# Patient Record
Sex: Female | Born: 1990 | Race: Black or African American | Hispanic: No | Marital: Single | Smoking: Former smoker
Health system: Southern US, Community
[De-identification: ages and names within clinical notes are randomized; demographics above are authoritative.]

## PROBLEM LIST (undated history)

## (undated) ENCOUNTER — Inpatient Hospital Stay (HOSPITAL_COMMUNITY): Payer: Self-pay

## (undated) DIAGNOSIS — A749 Chlamydial infection, unspecified: Secondary | ICD-10-CM

## (undated) DIAGNOSIS — F329 Major depressive disorder, single episode, unspecified: Secondary | ICD-10-CM

## (undated) DIAGNOSIS — F32A Depression, unspecified: Secondary | ICD-10-CM

## (undated) DIAGNOSIS — E669 Obesity, unspecified: Secondary | ICD-10-CM

## (undated) DIAGNOSIS — R519 Headache, unspecified: Secondary | ICD-10-CM

## (undated) DIAGNOSIS — O139 Gestational [pregnancy-induced] hypertension without significant proteinuria, unspecified trimester: Secondary | ICD-10-CM

## (undated) DIAGNOSIS — D649 Anemia, unspecified: Secondary | ICD-10-CM

## (undated) DIAGNOSIS — R51 Headache: Secondary | ICD-10-CM

## (undated) HISTORY — PX: FRACTURE SURGERY: SHX138

## (undated) HISTORY — PX: OTHER SURGICAL HISTORY: SHX169

## (undated) HISTORY — PX: GASTRIC BYPASS: SHX52

---
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
## 2009-04-15 ENCOUNTER — Emergency Department (HOSPITAL_COMMUNITY): Admission: EM | Admit: 2009-04-15 | Discharge: 2009-04-15 | Payer: Self-pay | Admitting: Emergency Medicine

## 2009-04-16 ENCOUNTER — Inpatient Hospital Stay (HOSPITAL_COMMUNITY): Admission: AD | Admit: 2009-04-16 | Discharge: 2009-04-16 | Payer: Self-pay | Admitting: Obstetrics & Gynecology

## 2009-04-19 ENCOUNTER — Inpatient Hospital Stay (HOSPITAL_COMMUNITY): Admission: AD | Admit: 2009-04-19 | Discharge: 2009-04-19 | Payer: Self-pay | Admitting: Obstetrics & Gynecology

## 2009-06-02 ENCOUNTER — Inpatient Hospital Stay (HOSPITAL_COMMUNITY): Admission: AD | Admit: 2009-06-02 | Discharge: 2009-06-02 | Payer: Self-pay | Admitting: Obstetrics & Gynecology

## 2009-07-10 ENCOUNTER — Ambulatory Visit: Payer: Self-pay | Admitting: Family

## 2009-07-10 ENCOUNTER — Inpatient Hospital Stay (HOSPITAL_COMMUNITY): Admission: AD | Admit: 2009-07-10 | Discharge: 2009-07-11 | Payer: Self-pay | Admitting: Obstetrics and Gynecology

## 2009-07-13 ENCOUNTER — Ambulatory Visit (HOSPITAL_COMMUNITY): Admission: RE | Admit: 2009-07-13 | Discharge: 2009-07-13 | Payer: Self-pay | Admitting: Obstetrics and Gynecology

## 2009-08-15 ENCOUNTER — Inpatient Hospital Stay (HOSPITAL_COMMUNITY): Admission: AD | Admit: 2009-08-15 | Discharge: 2009-08-15 | Payer: Self-pay | Admitting: Obstetrics and Gynecology

## 2009-08-15 ENCOUNTER — Ambulatory Visit: Payer: Self-pay | Admitting: Obstetrics and Gynecology

## 2009-10-31 ENCOUNTER — Inpatient Hospital Stay (HOSPITAL_COMMUNITY): Admission: AD | Admit: 2009-10-31 | Discharge: 2009-10-31 | Payer: Self-pay | Admitting: Obstetrics and Gynecology

## 2009-11-07 ENCOUNTER — Inpatient Hospital Stay (HOSPITAL_COMMUNITY): Admission: AD | Admit: 2009-11-07 | Discharge: 2009-11-07 | Payer: Self-pay | Admitting: Obstetrics and Gynecology

## 2009-12-12 ENCOUNTER — Ambulatory Visit (HOSPITAL_COMMUNITY): Admission: AD | Admit: 2009-12-12 | Discharge: 2009-12-12 | Payer: Self-pay | Admitting: Obstetrics and Gynecology

## 2009-12-14 ENCOUNTER — Inpatient Hospital Stay (HOSPITAL_COMMUNITY): Admission: AD | Admit: 2009-12-14 | Discharge: 2009-12-17 | Payer: Self-pay | Admitting: Obstetrics and Gynecology

## 2010-03-19 ENCOUNTER — Emergency Department (HOSPITAL_COMMUNITY): Admission: EM | Admit: 2010-03-19 | Discharge: 2010-03-19 | Payer: Self-pay | Admitting: Emergency Medicine

## 2010-09-05 ENCOUNTER — Encounter: Payer: Self-pay | Admitting: Obstetrics & Gynecology

## 2010-10-28 LAB — WET PREP, GENITAL
WBC, Wet Prep HPF POC: NONE SEEN
Yeast Wet Prep HPF POC: NONE SEEN

## 2010-10-28 LAB — GC/CHLAMYDIA PROBE AMP, GENITAL: Chlamydia, DNA Probe: NEGATIVE

## 2010-11-01 LAB — COMPREHENSIVE METABOLIC PANEL
ALT: 20 U/L (ref 0–35)
Alkaline Phosphatase: 182 U/L — ABNORMAL HIGH (ref 39–117)
CO2: 23 mEq/L (ref 19–32)
Chloride: 105 mEq/L (ref 96–112)
GFR calc non Af Amer: >60 mL/min (ref >60)
Glucose, Bld: 72 mg/dL (ref 70–99)
Potassium: 3.8 mEq/L (ref 3.5–5.1)
Sodium: 134 mEq/L — ABNORMAL LOW (ref 135–145)

## 2010-11-01 LAB — CBC
HCT: 29.3 % — ABNORMAL LOW (ref 36.0–46.0)
Hemoglobin: 11.2 g/dL — ABNORMAL LOW (ref 12.0–15.0)
MCHC: 34.2 g/dL (ref 30.0–36.0)
MCV: 86.3 fL (ref 78.0–100.0)
Platelets: 236 10*3/uL (ref 150–400)
RBC: 3.84 MIL/uL — ABNORMAL LOW (ref 3.87–5.11)
WBC: 8.7 10*3/uL (ref 4.0–10.5)

## 2010-11-01 LAB — LACTATE DEHYDROGENASE: LDH: 155 U/L (ref 94–250)

## 2010-11-01 LAB — PROTEIN, URINE, 24 HOUR: Protein, Urine: 4 mg/dL

## 2010-11-01 LAB — CREATININE, URINE, 24 HOUR
Creatinine, 24H Ur: 1472 mg/d (ref 700–1800)
Creatinine, Urine: 105.9 mg/dL

## 2010-11-07 LAB — URINE CULTURE
Colony Count: NO GROWTH
Culture: NO GROWTH

## 2010-11-07 LAB — URINALYSIS, ROUTINE W REFLEX MICROSCOPIC
Bilirubin Urine: NEGATIVE
Glucose, UA: NEGATIVE mg/dL
Ketones, ur: NEGATIVE mg/dL
Protein, ur: NEGATIVE mg/dL

## 2010-11-07 LAB — WET PREP, GENITAL
Clue Cells Wet Prep HPF POC: NONE SEEN
Trich, Wet Prep: NONE SEEN

## 2010-11-07 LAB — STREP B DNA PROBE

## 2010-11-07 LAB — URINE MICROSCOPIC-ADD ON

## 2010-11-16 LAB — HEPATITIS B SURFACE ANTIGEN: Hepatitis B Surface Ag: NEGATIVE

## 2010-11-16 LAB — DIFFERENTIAL
Basophils Absolute: 0.1 10*3/uL (ref 0.0–0.1)
Basophils Relative: 1 % (ref 0–1)
Lymphocytes Relative: 23 % (ref 12–46)
Neutro Abs: 6.7 10*3/uL (ref 1.7–7.7)
Neutrophils Relative %: 67 % (ref 43–77)

## 2010-11-16 LAB — HIV ANTIBODY (ROUTINE TESTING W REFLEX): HIV: NONREACTIVE

## 2010-11-16 LAB — RPR: RPR Ser Ql: NONREACTIVE

## 2010-11-16 LAB — CBC
Hemoglobin: 10.4 g/dL — ABNORMAL LOW (ref 12.0–15.0)
MCHC: 33.5 g/dL (ref 30.0–36.0)
Platelets: 278 10*3/uL (ref 150–400)
RDW: 13.5 % (ref 11.5–15.5)

## 2010-11-16 LAB — RUBELLA SCREEN: Rubella: 59.4 IU/mL — ABNORMAL HIGH

## 2010-11-17 LAB — URINALYSIS, ROUTINE W REFLEX MICROSCOPIC
Glucose, UA: NEGATIVE mg/dL
Specific Gravity, Urine: 1.02 (ref 1.005–1.030)
pH: 8.5 — ABNORMAL HIGH (ref 5.0–8.0)

## 2010-11-17 LAB — POCT PREGNANCY, URINE: Preg Test, Ur: POSITIVE

## 2010-11-18 LAB — AMYLASE: Amylase: 73 U/L (ref 27–131)

## 2010-11-18 LAB — URINALYSIS, ROUTINE W REFLEX MICROSCOPIC
Bilirubin Urine: NEGATIVE
Hgb urine dipstick: NEGATIVE
Nitrite: NEGATIVE
Nitrite: NEGATIVE
Specific Gravity, Urine: 1.024 (ref 1.005–1.030)
Specific Gravity, Urine: 1.02 (ref 1.005–1.030)
Urobilinogen, UA: 0.2 mg/dL (ref 0.0–1.0)
Urobilinogen, UA: 0.2 mg/dL (ref 0.0–1.0)

## 2010-11-18 LAB — COMPREHENSIVE METABOLIC PANEL
AST: 19 U/L (ref 0–37)
Albumin: 3.6 g/dL (ref 3.5–5.2)
Alkaline Phosphatase: 49 U/L (ref 39–117)
Chloride: 104 mEq/L (ref 96–112)
GFR calc Af Amer: >60 mL/min (ref >60)
Potassium: 3.9 mEq/L (ref 3.5–5.1)
Sodium: 135 mEq/L (ref 135–145)
Total Bilirubin: 0.6 mg/dL (ref 0.3–1.2)

## 2010-11-18 LAB — CBC
Platelets: 341 10*3/uL (ref 150–400)
WBC: 8.9 10*3/uL (ref 4.0–10.5)

## 2010-11-18 LAB — URINE CULTURE: Culture: NO GROWTH

## 2010-11-18 LAB — URINE MICROSCOPIC-ADD ON

## 2010-11-18 LAB — GC/CHLAMYDIA PROBE AMP, GENITAL: GC Probe Amp, Genital: NEGATIVE

## 2010-11-18 LAB — WET PREP, GENITAL: Clue Cells Wet Prep HPF POC: NONE SEEN

## 2010-12-25 ENCOUNTER — Inpatient Hospital Stay (HOSPITAL_COMMUNITY)
Admission: AD | Admit: 2010-12-25 | Discharge: 2010-12-25 | Disposition: A | Discharge status: Home / Self Care | Payer: Medicaid Other | Source: Ambulatory Visit | Attending: Obstetrics & Gynecology | Admitting: Obstetrics & Gynecology

## 2010-12-25 DIAGNOSIS — O99891 Other specified diseases and conditions complicating pregnancy: Secondary | ICD-10-CM | POA: Insufficient documentation

## 2010-12-25 DIAGNOSIS — O9989 Other specified diseases and conditions complicating pregnancy, childbirth and the puerperium: Secondary | ICD-10-CM

## 2010-12-25 LAB — URINALYSIS, ROUTINE W REFLEX MICROSCOPIC
Bilirubin Urine: NEGATIVE
Hgb urine dipstick: NEGATIVE
Nitrite: POSITIVE — AB
Specific Gravity, Urine: 1.025 (ref 1.005–1.030)
pH: 6.5 (ref 5.0–8.0)

## 2010-12-25 LAB — URINE MICROSCOPIC-ADD ON

## 2010-12-30 NOTE — Op Note (Signed)
NAME:  Alicia Morgan, Alicia Morgan        ACCOUNT NO.:  [id number]   MEDICAL RECORD NO.:  [id number]          PATIENT TYPE:  AMB   LOCATION:  [facility]                         FACILITY:  [facility]   PHYSICIAN:  Jene Every, M.D.    DATE OF BIRTH:  1991-02-08   DATE OF PROCEDURE:  02/08/2005  DATE OF DISCHARGE:                                 OPERATIVE REPORT   PREOPERATIVE DIAGNOSES:  Retained syndesmotic screw of the left ankle.   POSTOPERATIVE DIAGNOSES:  Retained syndesmotic screw of the left ankle.   PROCEDURE:  1.  Removal of syndesmotic screw left ankle.  2.  Stress x-rays under anesthesia.   BRIEF HISTORY:  A 20 year old status post ORIF of the ankle and syndesmosis  screw. Operative intervention was indicated for removal of the screw. The  patient has been noncompliant in weightbearing. Operative intervention for  removal. The risks and benefits discussed including bleeding, rerupture of  syndesmosis, etc.   TECHNIQUE:  The patient in the supine position and after the induction of an  adequate level of general anesthesia and 1 g of Kefzol, the left lower  extremity was prepped and draped in the usual sterile fashion. A previous  surgical incision was excised after infiltration with 0.25% Marcaine with  epinephrine. Bluntly dissected down to the head of the syndesmosis screw on  the fibula. I removed soft tissue fibrosis. The screw head was identified  and the screw was then removed with the screwdriver without difficulty, I  irrigated and debrided the hole. Then I closed the wound with 3-0 nylon  sutures. The wound was dressed sterilely, secured with an Ace bandage.   Under C-arm augmentation, I stressed the ankle and the syndesmosis was  stable.   IMPRESSION:  Status post hardware removal.   PLAN:  She was awoken without difficulty and transported to the recovery  room in satisfactory condition.   The patient tolerated the procedure well with no complications.       JB/MEDQ   D:  02/08/2005  T:  02/08/2005  Job:  [id number]

## 2010-12-30 NOTE — Op Note (Signed)
NAMENALIYA, GISH        ACCOUNT NO.:  [id number]   MEDICAL RECORD NO.:  [id number]          PATIENT TYPE:  OBV   LOCATION:  0453                         FACILITY:  [facility]   PHYSICIAN:  Jene Every, M.D.    DATE OF BIRTH:  1991-05-03   DATE OF PROCEDURE:  11/03/2004  DATE OF DISCHARGE:                                 OPERATIVE REPORT   PREOPERATIVE DIAGNOSIS:  Bimalleolar ankle fracture with ruptured  syndesmosis.   POSTOPERATIVE DIAGNOSIS:  Bimalleolar ankle fracture with ruptured  syndesmosis.   PROCEDURE PERFORMED:  Open reduction/internal fixation of bimalleolar ankle  fracture with repair of syndesmosis.   ANESTHESIA:  General.   ASSISTANT:  Roma Schanz, P.A.   INDICATIONS:  This is a 20 year old with bimalleolar ankle fracture and  widening of the syndesmosis.  Operative intervention was indicated for  repair of syndesmosis of the medial malleolus.  The risks and benefits have  been discussed, including bleeding, infection, damage to vascular  structures, malunion, nonunion, post-traumatic arthrosis, the need for  hardware removal, etc.   TECHNIQUE:  The patient was placed in supine position.  After the induction  of adequate general anesthesia and 1 gm of Kefzol, the left lower extremity  was prepped and draped and exsanguinated in the usual sterile fashion.  The  thigh tourniquet inflated to 350 mmHg.  The first incision was made over the  medial malleolus.  Subcutaneous tissue was dissected, and electrocautery was  utilized to achieve hemostasis.  I incised the periosteum over the fracture  site, exposed the fracture site.  There was a slight off-set in the fracture  site in evaluation of the joints, although I reduced the __________ with a  tenaculum and placed two guide pins to hold the medial malleolus.  I placed  a 45 partially threaded screw after the appropriate drilling.  Insertion of  the screw with excellent purchase.  Drilled over the  second guidewire, and  after removal, it was found that the guidewire had disassociated in the  subosseus region, although it was across the fracture site with excellent  stability.  It was felt to be a derotation type of a pin.  Evaluated under x-  ray and the periosteum, I could not see it exposed, it was just into the  subosseus bone, so I attempted to retrieve that, but would only compromise  the bony integrity of the major malleolus.  I felt this was just analogous  to a derotation K wire; therefore, felt it would be appropriate to leave it  intact.   Under x-ray, it was found to be anatomically reduced in the AP and lateral.  I then placed two small stab incisions of the skin laterally.  I curetted it  later into a full incision, about 1 cm in length.  Blunt dissection down the  outer aspect of the fibula.  I then used a large King-Tong type of retractor  and reduced the syndesmosis in the AP and lateral plane and pressed it,  utilizing the medial tibia and the fibula.  I then placed a syndesmosis  screw, a 3.5 screw, for cortices.  Did not lag  it.  After drilling from a  posterior to anterior direction parallel to the joint line, about 1.5 cm  above the joint line.  Excellent purchase was obtained.  The AP and lateral  plane was found to be satisfactory.  I then removed the King-Tong and  evaluated the syndesmosis in the AP and lateral plane with stress  radiographs without widening the syndesmosis.  Next, copiously irrigated  both wounds and closed the periosteum with 2-0 Vicryl simple sutures, small  staples in the skin bilaterally.  The tourniquet was deflated at 45 minutes.  There was adequate vascularization of the lower extremity appreciated.  Patient had a well-padded cast on the extremity in the neutral position with  excellent purchase and maintenance in regards to the syndesmosis, felt this  was satisfactory.   The patient was then extubated without difficulty and  transported to the  recovery room in satisfactory condition.  Patient tolerated the procedure  well.  No complications.      JB/MEDQ  D:  11/03/2004  T:  11/03/2004  Job:  [id number]

## 2011-03-12 ENCOUNTER — Emergency Department (HOSPITAL_COMMUNITY)
Admission: EM | Admit: 2011-03-12 | Discharge: 2011-03-12 | Disposition: A | Discharge status: Home / Self Care | Payer: Medicaid Other | Attending: Emergency Medicine | Admitting: Emergency Medicine

## 2011-03-12 DIAGNOSIS — H53149 Visual discomfort, unspecified: Secondary | ICD-10-CM | POA: Insufficient documentation

## 2011-03-12 DIAGNOSIS — H538 Other visual disturbances: Secondary | ICD-10-CM | POA: Insufficient documentation

## 2011-03-12 DIAGNOSIS — G43909 Migraine, unspecified, not intractable, without status migrainosus: Secondary | ICD-10-CM | POA: Insufficient documentation

## 2011-05-30 ENCOUNTER — Emergency Department (HOSPITAL_COMMUNITY)
Admission: EM | Admit: 2011-05-30 | Discharge: 2011-05-30 | Disposition: A | Discharge status: Home / Self Care | Payer: Self-pay | Attending: Emergency Medicine | Admitting: Emergency Medicine

## 2011-05-30 DIAGNOSIS — R197 Diarrhea, unspecified: Secondary | ICD-10-CM | POA: Insufficient documentation

## 2011-05-30 LAB — DIFFERENTIAL
Basophils Absolute: 0 10*3/uL (ref 0.0–0.1)
Basophils Relative: 0 % (ref 0–1)
Eosinophils Absolute: 0.2 10*3/uL (ref 0.0–0.7)
Eosinophils Relative: 3 % (ref 0–5)
Lymphocytes Relative: 32 % (ref 12–46)

## 2011-05-30 LAB — URINALYSIS, ROUTINE W REFLEX MICROSCOPIC
Glucose, UA: NEGATIVE mg/dL
Protein, ur: NEGATIVE mg/dL
Urobilinogen, UA: 0.2 mg/dL (ref 0.0–1.0)

## 2011-05-30 LAB — POCT I-STAT, CHEM 8
Glucose, Bld: 92 mg/dL (ref 70–99)
HCT: 40 % (ref 36.0–46.0)
Hemoglobin: 13.6 g/dL (ref 12.0–15.0)
Potassium: 4.8 mEq/L (ref 3.5–5.1)
Sodium: 140 mEq/L (ref 135–145)
TCO2: 23 mmol/L (ref 0–100)

## 2011-05-30 LAB — CBC
HCT: 37.2 % (ref 36.0–46.0)
MCHC: 33.9 g/dL (ref 30.0–36.0)
Platelets: 320 10*3/uL (ref 150–400)
RDW: 13.2 % (ref 11.5–15.5)
WBC: 7.5 10*3/uL (ref 4.0–10.5)

## 2011-05-30 LAB — URINE MICROSCOPIC-ADD ON

## 2011-06-02 LAB — URINE CULTURE
Colony Count: >=100000
Culture  Setup Time: 201210170056

## 2011-07-26 ENCOUNTER — Emergency Department (HOSPITAL_COMMUNITY)
Admission: EM | Admit: 2011-07-26 | Discharge: 2011-07-26 | Disposition: A | Discharge status: Home / Self Care | Payer: Self-pay | Attending: Emergency Medicine | Admitting: Emergency Medicine

## 2011-07-26 ENCOUNTER — Encounter: Payer: Self-pay | Admitting: Adult Health

## 2011-07-26 DIAGNOSIS — F17200 Nicotine dependence, unspecified, uncomplicated: Secondary | ICD-10-CM | POA: Insufficient documentation

## 2011-07-26 DIAGNOSIS — R197 Diarrhea, unspecified: Secondary | ICD-10-CM | POA: Insufficient documentation

## 2011-07-26 LAB — POCT I-STAT, CHEM 8
Creatinine, Ser: 0.6 mg/dL (ref 0.50–1.10)
HCT: 41 % (ref 36.0–46.0)
Hemoglobin: 13.9 g/dL (ref 12.0–15.0)
Sodium: 141 mEq/L (ref 135–145)
TCO2: 25 mmol/L (ref 0–100)

## 2011-07-26 LAB — PREGNANCY, URINE: Preg Test, Ur: NEGATIVE

## 2011-07-26 LAB — URINALYSIS, ROUTINE W REFLEX MICROSCOPIC
Bilirubin Urine: NEGATIVE
Hgb urine dipstick: NEGATIVE
Ketones, ur: NEGATIVE mg/dL
Protein, ur: NEGATIVE mg/dL
Urobilinogen, UA: 0.2 mg/dL (ref 0.0–1.0)

## 2011-07-26 LAB — DIFFERENTIAL
Basophils Absolute: 0 10*3/uL (ref 0.0–0.1)
Basophils Relative: 0 % (ref 0–1)
Eosinophils Absolute: 0.1 10*3/uL (ref 0.0–0.7)
Eosinophils Relative: 2 % (ref 0–5)
Monocytes Absolute: 0.4 10*3/uL (ref 0.1–1.0)
Monocytes Relative: 5 % (ref 3–12)

## 2011-07-26 LAB — CBC
HCT: 37.5 % (ref 36.0–46.0)
Hemoglobin: 12.6 g/dL (ref 12.0–15.0)
MCH: 27.9 pg (ref 26.0–34.0)
MCHC: 33.6 g/dL (ref 30.0–36.0)
RDW: 13.2 % (ref 11.5–15.5)

## 2011-07-26 LAB — URINE MICROSCOPIC-ADD ON

## 2011-07-26 MED ORDER — DIPHENOXYLATE-ATROPINE 2.5-0.025 MG PO TABS
2.0000 | ORAL_TABLET | Freq: Once | ORAL | Status: AC
  Administered 2011-07-26: 2 via ORAL
  Filled 2011-07-26: qty 2

## 2011-07-26 MED ORDER — SODIUM CHLORIDE 0.9 % IV BOLUS (SEPSIS)
1000.0000 mL | Freq: Once | INTRAVENOUS | Status: AC
  Administered 2011-07-26: 1000 mL via INTRAVENOUS

## 2011-07-26 MED ORDER — DIPHENOXYLATE-ATROPINE 2.5-0.025 MG PO TABS: ORAL_TABLET | ORAL | Status: DC

## 2011-07-26 NOTE — ED Notes (Signed)
C/o of watery diarrhea, abdominal pain, indigestion and nausea. Seen here for same.

## 2011-07-26 NOTE — ED Notes (Signed)
Patient discharged and left cell phone charger. Patient contact information, phone number called, phone message of disconnected. Patient belonging given to security and labeled. RN also notified

## 2011-07-26 NOTE — ED Provider Notes (Addendum)
History     CSN: [id number] Arrival date & time: 07/26/2011  8:31 AM   First MD Initiated Contact with Patient 07/26/11 [phone]      No chief complaint on file.   (Consider location/radiation/quality/duration/timing/severity/associated sxs/prior treatment) Patient is a 20 y.o. female presenting with diarrhea. The history is provided by the patient and medical records. No language interpreter was used.  Diarrhea The primary symptoms include diarrhea. Primary symptoms do not include fever. Primary symptoms comment: Patient says she has excessive burping, and her burping smells funny,, with a feculent odor. She has been having watery stools. She had a similar episode in October 2012, treated with Lomotil. The illness began yesterday. The onset was sudden.    History reviewed. No pertinent past medical history.  History reviewed. No pertinent past surgical history.  History reviewed. No pertinent family history.  History  Substance Use Topics  . Smoking status: Current Everyday Smoker    Types: Cigars  . Smokeless tobacco: Not on file  . Alcohol Use: No    OB History    Grav Para Term Preterm Abortions TAB SAB Ect Mult Living                  Review of Systems  Constitutional: Negative.  Negative for fever.  HENT: Negative.   Eyes: Negative.   Cardiovascular: Negative.   Gastrointestinal: Positive for diarrhea.       Abdominal cramping --> episodes of watery diarrhea.  Genitourinary: Negative.   Musculoskeletal: Negative.   Neurological: Negative.   Psychiatric/Behavioral: Negative.     Allergies  Review of patient's allergies indicates no known allergies.  Home Medications  No current outpatient prescriptions on file.  BP 138/75  Pulse 74  Temp(Src) 98.1 F (36.7 C) (Oral)  Resp 18  SpO2 100%  Physical Exam  Nursing note and vitals reviewed. Constitutional: She is oriented to person, place, and time. She appears well-developed and well-nourished.       In  mild distress, complaining of diarrhea.  HENT:  Head: Normocephalic and atraumatic.  Right Ear: External ear normal.  Left Ear: External ear normal.  Mouth/Throat: Oropharynx is clear and moist.  Eyes: Conjunctivae and EOM are normal. Pupils are equal, round, and reactive to light.  Neck: Normal range of motion. Neck supple.  Cardiovascular: Normal rate, regular rhythm and normal heart sounds.   Pulmonary/Chest: Effort normal and breath sounds normal.  Abdominal: Soft. Bowel sounds are normal. She exhibits no distension. There is no tenderness. There is no rebound and no guarding.  Musculoskeletal: Normal range of motion.  Neurological: She is alert and oriented to person, place, and time.       No sensory or motor deficit.  Skin: Skin is warm and dry.  Psychiatric: She has a normal mood and affect. Her behavior is normal.    ED Course  Procedures (including critical care time)   9:13 AM Patient was seen and had physical examination. IV fluids were ordered to rehydrate the patient. Basic lab tests were ordered. Old charts were reviewed. She had a gastroenteritis on 05/30/2011.  11:32 AM Results for orders placed during the hospital encounter of 07/26/11  CBC      Component Value Range   WBC 7.3  4.0 - 10.5 (K/uL)   RBC 4.52  3.87 - 5.11 (MIL/uL)   Hemoglobin 12.6  12.0 - 15.0 (g/dL)   HCT 56.2  13.0 - 86.5 (%)   MCV 83.0  78.0 - 100.0 (fL)   MCH  27.9  26.0 - 34.0 (pg)   MCHC 33.6  30.0 - 36.0 (g/dL)   RDW 16.1  09.6 - 04.5 (%)   Platelets 364  150 - 400 (K/uL)  DIFFERENTIAL      Component Value Range   Neutrophils Relative 64  43 - 77 (%)   Neutro Abs 4.7  1.7 - 7.7 (K/uL)   Lymphocytes Relative 29  12 - 46 (%)   Lymphs Abs 2.1  0.7 - 4.0 (K/uL)   Monocytes Relative 5  3 - 12 (%)   Monocytes Absolute 0.4  0.1 - 1.0 (K/uL)   Eosinophils Relative 2  0 - 5 (%)   Eosinophils Absolute 0.1  0.0 - 0.7 (K/uL)   Basophils Relative 0  0 - 1 (%)   Basophils Absolute 0.0  0.0 -  0.1 (K/uL)  URINALYSIS, ROUTINE W REFLEX MICROSCOPIC      Component Value Range   Color, Urine YELLOW  YELLOW    APPearance CLOUDY (*) CLEAR    Specific Gravity, Urine 1.028  1.005 - 1.030    pH 5.5  5.0 - 8.0    Glucose, UA NEGATIVE  NEGATIVE (mg/dL)   Hgb urine dipstick NEGATIVE  NEGATIVE    Bilirubin Urine NEGATIVE  NEGATIVE    Ketones, ur NEGATIVE  NEGATIVE (mg/dL)   Protein, ur NEGATIVE  NEGATIVE (mg/dL)   Urobilinogen, UA 0.2  0.0 - 1.0 (mg/dL)   Nitrite NEGATIVE  NEGATIVE    Leukocytes, UA SMALL (*) NEGATIVE   PREGNANCY, URINE      Component Value Range   Preg Test, Ur NEGATIVE    POCT I-STAT, CHEM 8      Component Value Range   Sodium 141  135 - 145 (mEq/L)   Potassium 4.2  3.5 - 5.1 (mEq/L)   Chloride 106  96 - 112 (mEq/L)   BUN 4 (*) 6 - 23 (mg/dL)   Creatinine, Ser 4.09  0.50 - 1.10 (mg/dL)   Glucose, Bld 811 (*) 70 - 99 (mg/dL)   Calcium, Ion 9.14  1.12 - 1.32 (mmol/L)   TCO2 25  0 - 100 (mmol/L)   Hemoglobin 13.9  12.0 - 15.0 (g/dL)   HCT 78.2  95.6 - 21.3 (%)  URINE MICROSCOPIC-ADD ON      Component Value Range   Squamous Epithelial / LPF FEW (*) RARE    WBC, UA 7-10  <3 (WBC/hpf)   Bacteria, UA MANY (*) RARE    Urine-Other MUCOUS PRESENT     11:33 AM Patient's lab workup was benign. She has received a liter of IV fluids and a dose of Lomotil. She still has cramps in her abdomen and diarrhea. I advised her that this illness would take time to improve, and that she should drink plenty of liquids and take Lomotil 2 tablets every 4 hours as needed for cramping. Will refer her to a gastroenterologist as this is the second time she said cramping and diarrhea in the past months. Dr. Melvia Heaps is on for GI.    1. Diarrhea          Carleene Cooper III, MD 07/26/11 1140  Carleene Cooper III, MD 09/07/11 [phone]

## 2011-07-26 NOTE — ED Notes (Signed)
Patient given work/school note per request

## 2011-07-27 ENCOUNTER — Encounter: Payer: Self-pay | Admitting: Nurse Practitioner

## 2011-07-27 ENCOUNTER — Telehealth: Payer: Self-pay | Admitting: Internal Medicine

## 2011-07-27 NOTE — Telephone Encounter (Signed)
Pt reports she wakes up with "gurgling sounds" in her abdomen with cramping that begins above her waist that radiates down to her lower abdomen that is relieved  with loose watery stools. The stools range in color from yellow, green and normal brown. She also c/o gas bubbles like reflux that she can't expel. She is not on a PPI. She denies urgency during the night. Pt has been to the ER x 2 and each time was given Lomotil- she did not have the Lomotil filled yet from 07/26/11 ER visit. Pt given an appt with Willette Cluster, NP tomorrow at 10am and she was given directions to our ofc.

## 2011-07-27 NOTE — Telephone Encounter (Signed)
lmom for pt to call back. Dr Jarold Motto, [facility].

## 2011-07-27 NOTE — Telephone Encounter (Signed)
Should have been Dr. Jarold Motto not Dr. Rhea Belton.Marland KitchenMarland Kitchen

## 2011-07-28 ENCOUNTER — Ambulatory Visit: Payer: Medicaid Other | Admitting: Nurse Practitioner

## 2011-07-28 LAB — URINE CULTURE

## 2011-07-29 NOTE — ED Notes (Signed)
+   Urine culture. Chart sent to EDP office for review 

## 2011-08-01 NOTE — ED Notes (Signed)
Unable to contact via  Letter sent to [facility] address.

## 2011-08-01 NOTE — ED Notes (Signed)
Rx for Cipro 500 mg po BID x 3 days written by Dr Linwood Dibbles need to be called to pharmacy.

## 2011-10-26 ENCOUNTER — Emergency Department (HOSPITAL_COMMUNITY)
Admission: EM | Admit: 2011-10-26 | Discharge: 2011-10-26 | Disposition: A | Discharge status: Home / Self Care | Payer: Self-pay | Attending: Emergency Medicine | Admitting: Emergency Medicine

## 2011-10-26 ENCOUNTER — Encounter (HOSPITAL_COMMUNITY): Payer: Self-pay

## 2011-10-26 DIAGNOSIS — F17200 Nicotine dependence, unspecified, uncomplicated: Secondary | ICD-10-CM | POA: Insufficient documentation

## 2011-10-26 DIAGNOSIS — N72 Inflammatory disease of cervix uteri: Secondary | ICD-10-CM | POA: Insufficient documentation

## 2011-10-26 LAB — URINE MICROSCOPIC-ADD ON

## 2011-10-26 LAB — URINALYSIS, ROUTINE W REFLEX MICROSCOPIC
Bilirubin Urine: NEGATIVE
Glucose, UA: NEGATIVE mg/dL
Ketones, ur: NEGATIVE mg/dL
Nitrite: NEGATIVE
Urobilinogen, UA: 1 mg/dL (ref 0.0–1.0)

## 2011-10-26 LAB — POCT PREGNANCY, URINE: Preg Test, Ur: NEGATIVE

## 2011-10-26 LAB — WET PREP, GENITAL: Yeast Wet Prep HPF POC: NONE SEEN

## 2011-10-26 MED ORDER — AZITHROMYCIN 1 G PO PACK
1.0000 g | PACK | ORAL | Status: AC
  Administered 2011-10-26: 1 g via ORAL
  Filled 2011-10-26: qty 1

## 2011-10-26 MED ORDER — CEFTRIAXONE SODIUM 250 MG IJ SOLR
250.0000 mg | Freq: Once | INTRAMUSCULAR | Status: AC
  Administered 2011-10-26: 250 mg via INTRAMUSCULAR
  Filled 2011-10-26: qty 250

## 2011-10-26 MED ORDER — AZITHROMYCIN 250 MG PO TABS
ORAL_TABLET | ORAL | Status: AC
  Administered 2011-10-26: 17:00:00
  Filled 2011-10-26: qty 4

## 2011-10-26 MED ORDER — LIDOCAINE HCL 1 % IJ SOLN
INTRAMUSCULAR | Status: AC
  Filled 2011-10-26: qty 20

## 2011-10-26 NOTE — ED Provider Notes (Signed)
History     CSN: [id number]  Arrival date & time 10/26/11  1421   First MD Initiated Contact with Patient 10/26/11 1537      Chief Complaint  Patient presents with  . Vaginal Discharge    The history is provided by the patient.   the patient reports 24 hours of increasing foul-smelling vaginal discharge.  She denies abdominal pain.  She denies nausea vomiting and diarrhea.  She does report douching with tap water a week ago and again yesterday.  She does report possible unprotected sex a week ago.  She denies fevers and chills.  She has no other complaints.  History reviewed. No pertinent past medical history.  Past Surgical History  Procedure Date  . Open reduction and internal fixation of ankle fracture.     No family history on file.  History  Substance Use Topics  . Smoking status: Current Everyday Smoker    Types: Cigars  . Smokeless tobacco: Not on file  . Alcohol Use: Yes    OB History    Grav Para Term Preterm Abortions TAB SAB Ect Mult Living                  Review of Systems  All other systems reviewed and are negative.    Allergies  Review of patient's allergies indicates no known allergies.  Home Medications  No current outpatient prescriptions on file.  BP 124/71  Pulse 81  Temp(Src) 99.5 F (37.5 C) (Oral)  Resp 18  SpO2 100%  LMP 10/09/2011  Physical Exam  Nursing note and vitals reviewed. Constitutional: She is oriented to person, place, and time. She appears well-developed and well-nourished. No distress.  HENT:  Head: Normocephalic and atraumatic.  Eyes: EOM are normal.  Neck: Normal range of motion.  Cardiovascular: Normal rate, regular rhythm and normal heart sounds.   Pulmonary/Chest: Effort normal and breath sounds normal.  Abdominal: Soft. She exhibits no distension. There is no tenderness.  Genitourinary:       Normal external genitalia.  Patient has occurred light green foul-smelling discharge.  She has cervical motion  tenderness.  Musculoskeletal: Normal range of motion.  Neurological: She is alert and oriented to person, place, and time.  Skin: Skin is warm and dry.  Psychiatric: She has a normal mood and affect. Judgment normal.    ED Course  Procedures (including critical care time)  Labs Reviewed  WET PREP, GENITAL - Abnormal; Notable for the following:    WBC, Wet Prep HPF POC TOO NUMEROUS TO COUNT (*)    All other components within normal limits  POCT PREGNANCY, URINE  URINALYSIS, ROUTINE W REFLEX MICROSCOPIC  GC/CHLAMYDIA PROBE AMP, GENITAL   No results found.   1. Cervicitis       MDM  4:35 PM I will tx the pt for cervicitis.  This may represent gonorrhea given the amount of discharge that she's having.  The patient has been referred to the health department for additional STD screening.  She's been encouraged not have intercourse with anyone until of her symptoms are resolved.  She was instructed to have protected intercourse with condoms.  She understands to inform sexual partners to be evaluated and seen at the health department        Lyanne Co, MD 10/26/11 [phone]

## 2011-10-26 NOTE — ED Notes (Signed)
Pt  C/o green vaginal discharge, states ?unprotective sex; pt wants to be checked for AIDS

## 2011-10-26 NOTE — Discharge Instructions (Signed)
Cervicitis   Cervicitis is a soreness and swelling (inflammation) of the cervix. Your cervix is located at the bottom of your uterus which opens up to the vagina.   CAUSES   Sexually transmitted infections (STIs).   Allergic reaction.   Medicines or birth control devices that are put in the vagina.   Injury to the cervix.   Bacterial infections.   SYMPTOMS   There may be no symptoms. If symptoms occur, they may include:   Grey, white, yellow, or bad smelling vaginal discharge.   Pain or itching of the area outside the vagina.   Painful sexual intercourse.   Lower abdominal or lower back pain, especially during intercourse.   Frequent urination.   Abnormal vaginal bleeding between periods, after sexual intercourse, or after menopause.   Pressure or a heavy feeling in the pelvis.   DIAGNOSIS   Diagnosis is made after a pelvic exam. Other tests may include:   Examination of any discharge under a microscope (wet prep).   A Pap test.   TREATMENT   Treatment will depend on the cause of cervicitis. If it is caused by an STI, both you and your partner will need to be treated. Antibiotic medicines will be given.   HOME CARE INSTRUCTIONS   Do not have sexual intercourse until your caregiver says it is okay.   Do not have sexual intercourse until your partner has been treated if your cervicitis is caused by an STI.   Take your antibiotics as directed. Finish them even if you start to feel better.   SEEK IMMEDIATE MEDICAL CARE IF:   Your symptoms come back.   You have a fever.   You experience any problems that may be related to the medicine you are taking.   MAKE SURE YOU:   Understand these instructions.   Will watch your condition.   Will get help right away if you are not doing well or get worse.   Document Released: 07/31/2005 Document Revised: 07/20/2011 Document Reviewed: 02/27/2011   Exit[employer] Patient Information 2012 [employer]

## 2012-07-12 ENCOUNTER — Emergency Department (HOSPITAL_COMMUNITY)
Admission: EM | Admit: 2012-07-12 | Discharge: 2012-07-12 | Disposition: A | Discharge status: Home / Self Care | Payer: Self-pay | Attending: Emergency Medicine | Admitting: Emergency Medicine

## 2012-07-12 ENCOUNTER — Encounter (HOSPITAL_COMMUNITY): Payer: Self-pay | Admitting: *Deleted

## 2012-07-12 DIAGNOSIS — Y990 Civilian activity done for income or pay: Secondary | ICD-10-CM | POA: Insufficient documentation

## 2012-07-12 DIAGNOSIS — X500XXA Overexertion from strenuous movement or load, initial encounter: Secondary | ICD-10-CM | POA: Insufficient documentation

## 2012-07-12 DIAGNOSIS — Z3202 Encounter for pregnancy test, result negative: Secondary | ICD-10-CM | POA: Insufficient documentation

## 2012-07-12 DIAGNOSIS — R109 Unspecified abdominal pain: Secondary | ICD-10-CM | POA: Insufficient documentation

## 2012-07-12 DIAGNOSIS — IMO0002 Reserved for concepts with insufficient information to code with codable children: Secondary | ICD-10-CM | POA: Insufficient documentation

## 2012-07-12 DIAGNOSIS — T148XXA Other injury of unspecified body region, initial encounter: Secondary | ICD-10-CM

## 2012-07-12 DIAGNOSIS — Y9389 Activity, other specified: Secondary | ICD-10-CM | POA: Insufficient documentation

## 2012-07-12 DIAGNOSIS — F17200 Nicotine dependence, unspecified, uncomplicated: Secondary | ICD-10-CM | POA: Insufficient documentation

## 2012-07-12 DIAGNOSIS — Y9289 Other specified places as the place of occurrence of the external cause: Secondary | ICD-10-CM | POA: Insufficient documentation

## 2012-07-12 LAB — URINALYSIS, ROUTINE W REFLEX MICROSCOPIC
Glucose, UA: NEGATIVE mg/dL
Protein, ur: 30 mg/dL — AB

## 2012-07-12 LAB — POCT PREGNANCY, URINE: Preg Test, Ur: NEGATIVE

## 2012-07-12 LAB — URINE MICROSCOPIC-ADD ON

## 2012-07-12 MED ORDER — METHOCARBAMOL 750 MG PO TABS: 750.0000 mg | ORAL_TABLET | Freq: Four times a day (QID) | ORAL | Status: DC

## 2012-07-12 MED ORDER — IBUPROFEN 800 MG PO TABS: 800.0000 mg | ORAL_TABLET | Freq: Three times a day (TID) | ORAL | Status: DC

## 2012-07-12 MED ORDER — OXYCODONE-ACETAMINOPHEN 5-325 MG PO TABS: 2.0000 | ORAL_TABLET | ORAL | Status: DC | PRN

## 2012-07-12 NOTE — Progress Notes (Signed)
CARE MANAGEMENT ED NOTE 07/12/2012  Patient:  Alicia Morgan, Alicia Morgan   Account Number:  [id number]  Date Initiated:  07/12/2012  Documentation initiated by:  Edd Arbour  Subjective/Objective Assessment:   21 yr old self pay pt states she had medicaid for her 2 yr old child SHe reports having a card at home but has had difficulty with medicaid not being active Moved from her mother to another area and received a letter with a newmedicaid card     Subjective/Objective Assessment Detail:   states new card received for her and baby Came from work to ED without new medicaid card     Action/Plan:   Encouraged pt to bring medicaid card back to [facility] ED for processing and to attempt to confirm if medicaid is renewed Encouraged her to speak with DSS staff about her concern   Action/Plan Detail:   CM spoke with outpatient pharmacy staff to get cost of robaxin, oxycodone and ibuprofen COST is 22.14 + tax CM discussed $22.14 cost with pt who agrees to pay outpatient pharmacy Requested a work note. ED RN informed   Anticipated DC Date:  07/12/2012     Status Recommendation to Physician:   Result of Recommendation:    Other ED Services  Consult Working Plan    DC Planning Services  CM consult  [facility]  Outpatient Services - Pt will follow up  Other  PCP issues    Choice offered to / List presented to:            Status of service:  Completed, signed off  ED Comments:   ED Comments Detail:  07/12/12 1825 CM spoke with pt who confirms she self pay [place] resident.  CM discussed and provided written information for self pay pcps, importance of pcp for f/u care, www.needymeds.org, discounted pharmacies, outpatient pharmacies and other [place] resources such as financial assistance, DSS, health department Pt voiced understanding and appreciative of resources Encouraged to complete needymeds.org application for robaxin and to go to DSS in person to verify her  medicaid

## 2012-07-12 NOTE — ED Provider Notes (Signed)
History     CSN: [id number]  Arrival date & time 07/12/12  1622   First MD Initiated Contact with Patient 07/12/12 1641      Chief Complaint  Patient presents with  . Abdominal Pain    (Consider location/radiation/quality/duration/timing/severity/associated sxs/prior treatment) Patient is a 21 y.o. female presenting with abdominal pain. The history is provided by the patient.  Abdominal Pain The primary symptoms of the illness include abdominal pain.   patient here with left upper back and left costal margin pain that started 2 days ago after she was lifting a heavy object at work. Pain is worse with certain positions. Denies any dyspnea. No treatment used prior to arrival. Patient states that when she is not moving she is not short of breath. States that when she turns a certain direction he gets worse.  History reviewed. No pertinent past medical history.  Past Surgical History  Procedure Date  . Open reduction and internal fixation of ankle fracture.     No family history on file.  History  Substance Use Topics  . Smoking status: Current Every Day Smoker    Types: Cigars  . Smokeless tobacco: Not on file  . Alcohol Use: Yes    OB History    Grav Para Term Preterm Abortions TAB SAB Ect Mult Living                  Review of Systems  Gastrointestinal: Positive for abdominal pain.  All other systems reviewed and are negative.    Allergies  Review of patient's allergies indicates no known allergies.  Home Medications  No current outpatient prescriptions on file.  BP 138/75  Pulse 92  Temp 97.8 F (36.6 C) (Oral)  Resp 17  SpO2 99%  LMP 06/30/2012  Physical Exam  Nursing note and vitals reviewed. Constitutional: She is oriented to person, place, and time. She appears well-developed and well-nourished.  Non-toxic appearance. No distress.  HENT:  Head: Normocephalic and atraumatic.  Eyes: Conjunctivae normal, EOM and lids are normal. Pupils are equal,  round, and reactive to light.  Neck: Normal range of motion. Neck supple. No tracheal deviation present. No mass present.  Cardiovascular: Normal rate, regular rhythm and normal heart sounds.  Exam reveals no gallop.   No murmur heard. Pulmonary/Chest: Effort normal and breath sounds normal. No stridor. No respiratory distress. She has no decreased breath sounds. She has no wheezes. She has no rhonchi. She has no rales.  Abdominal: Soft. Normal appearance and bowel sounds are normal. She exhibits no distension. There is no tenderness. There is no rebound and no CVA tenderness.  Musculoskeletal: Normal range of motion. She exhibits no edema and no tenderness.       Arms:      Patient with musculoskeletal pain as documented on diagram. No rashes noted. Skin normal  Neurological: She is alert and oriented to person, place, and time. She has normal strength. No cranial nerve deficit or sensory deficit. GCS eye subscore is 4. GCS verbal subscore is 5. GCS motor subscore is 6.  Skin: Skin is warm and dry. No abrasion and no rash noted.  Psychiatric: She has a normal mood and affect. Her speech is normal and behavior is normal.    ED Course  Procedures (including critical care time)   Labs Reviewed  POCT PREGNANCY, URINE  URINALYSIS, ROUTINE W REFLEX MICROSCOPIC   No results found.   No diagnosis found.    MDM  Patient without concern for pneumothorax.  She likely has musculoskeletal strain from lifting a heavy object at work. Was given medications at this time and return instructions given        Toy Baker, MD 07/12/12 [phone]

## 2012-07-12 NOTE — ED Notes (Addendum)
Rt upper abd pain  Radiates to back at times.Also c/o urinary frequency

## 2012-07-15 LAB — URINE CULTURE: Colony Count: >=100000

## 2012-07-20 NOTE — ED Notes (Signed)
Attempt to contact patient regarding + urine culture. Phone numbers provided in epic are both wrong #. Will send letter

## 2012-07-20 NOTE — ED Notes (Signed)
Chart received from EDP . Give patient Cipro 250 mg one PO BID, dispense six tabs, no refills prescriber Dahlia Client Muthersbaugh PA

## 2012-07-21 NOTE — ED Notes (Signed)
Unable to contact patient via phone. Sent letter. °

## 2014-04-04 ENCOUNTER — Emergency Department (HOSPITAL_COMMUNITY)
Admission: EM | Admit: 2014-04-04 | Discharge: 2014-04-04 | Disposition: A | Discharge status: Home / Self Care | Payer: Medicaid Other | Attending: Emergency Medicine | Admitting: Emergency Medicine

## 2014-04-04 ENCOUNTER — Encounter (HOSPITAL_COMMUNITY): Payer: Self-pay | Admitting: Emergency Medicine

## 2014-04-04 ENCOUNTER — Emergency Department (HOSPITAL_COMMUNITY): Payer: Medicaid Other

## 2014-04-04 DIAGNOSIS — Z8781 Personal history of (healed) traumatic fracture: Secondary | ICD-10-CM | POA: Insufficient documentation

## 2014-04-04 DIAGNOSIS — F17200 Nicotine dependence, unspecified, uncomplicated: Secondary | ICD-10-CM | POA: Insufficient documentation

## 2014-04-04 DIAGNOSIS — G8929 Other chronic pain: Secondary | ICD-10-CM

## 2014-04-04 DIAGNOSIS — M25579 Pain in unspecified ankle and joints of unspecified foot: Secondary | ICD-10-CM | POA: Insufficient documentation

## 2014-04-04 DIAGNOSIS — M25572 Pain in left ankle and joints of left foot: Secondary | ICD-10-CM

## 2014-04-04 MED ORDER — IBUPROFEN 800 MG PO TABS: 800.0000 mg | ORAL_TABLET | Freq: Three times a day (TID) | ORAL | Status: DC

## 2014-04-04 NOTE — Discharge Instructions (Signed)
[facility] for further management of your ankle pain.  Chronic Ankle Instability with Rehab Chronic ankle instability is characterized by instability of the ankle for a prolonged period of time. There are two types of ankle instability.   A functionally unstable ankle is one that gives way; however, it may or may not be loose.  A mechanically unstable ankle is one that is loose due to a problem with the ligaments. However, not all loose ankles are unstable or give way. SYMPTOMS   Recurrent ankle pain and giving way of the ankle.  Difficulty running on uneven surfaces, jumping, or changing directions while running (cutting).  Pain, tenderness, swelling, and bruising at the site of injury.  Weakness or looseness in the ankle joint.  Occasionally, impaired ability to walk soon after injury. CAUSES   Ankle instability is most commonly caused by a previous ankle injury that did not completely heal.  Ankle instability may also be caused by stress imposed from either side of the ankle joint that can temporarily force or pry the ankle bone (talus) out of its normal alignment. The ligaments that hold the joint in place are stretched and torn. RISK INCREASES WITH:  Previous ankle injury.  You were born with (congenital) joint looseness.  Too-rapid return to activity after previous ankle sprain.  Activities in which the foot may land sideways while running, walking, and jumping (basketball, volleyball, or soccer) or walking or running on uneven or rough surfaces.  Inadequate ankle support during athletics.  Poor strength and flexibility.  Poor balance skills. PREVENTION  Warm up and stretch properly before activity.  Maintain physical fitness:  Ankle and leg flexibility, muscle strength, and endurance.  Balanced training activities.  Cardiovascular fitness.  Learn and use proper technique during sports and have a coach correct  improper technique.  Taping, protective strapping, bracing, or high-top tennis shoes may be used. Initially, tape is best; however, it loses most of its support function within 10 to 15 minutes.  Wear proper protective shoes (high-top shoes with taping or bracing).  Provide the ankle with support during sports and practice activities for 12 months following injury.  Complete rehabilitation after initial injury. PROGNOSIS  If treated properly, ankle instability normally resolves with non-surgical treatment. However, for certain cases of mechanical instability surgery is necessary. RELATED COMPLICATIONS   Frequent recurrence of symptoms is possible. Following rehabilitation guidelines correctly decreases the frequency of recurrence and optimizes healing time.  Injury to other structures (bone, cartilage, or tendon).  Chronically unstable or arthritic ankle joint.  Complications of surgery including infection, bleeding, injury to nerves, continued giving way, ankle stiffness, and ankle weakness. TREATMENT Treatment initially involves ice, medication, and compression bandages are used to help reduce pain and inflammation, It may be necessary to immobilize the joint for a period of time to allow for healing. Strengthening and stretching exercises are recommended after immobilization to help regain strength and flexibility. These exercises may be completed at home or with a therapist. Some individuals find placing a heel wedge in the shoe, taping or bracing, and wearing high-top shoes helpful. If symptoms last for longer than 3 months, despite treatment, then surgery may be recommended. HEAT AND COLD  Cold treatment (icing) relieves pain and reduces inflammation. Cold treatment should be applied for 10 to 15 minutes every 2 to 3 hours for inflammation and pain and immediately after any activity that aggravates your symptoms. Use ice packs or an ice massage.  Heat treatment may  be used prior to  performing the stretching and strengthening activities prescribed by your caregiver, physical therapist, or athletic trainer. Use a heat pack or a warm soak. MEDICATION   There are no specific medications to improve the stability of your ankle.  If pain medication is necessary, then nonsteroidal anti-inflammatory medications, such as aspirin and ibuprofen, or other minor pain relievers, such as acetaminophen, are often recommended.  Do not take pain medication within 7 days before surgery.  Prescription pain relievers may be prescribed if deemed necessary by your caregiver. Use only as directed and only as much as you need.  Ointments applied to the skin may be helpful. SEEK MEDICAL CARE IF:   Pain, swelling, or bruising worsens despite treatment.  You develop locking or catching in the ankle.  You have pain, numbness, or coldness in the foot.  You develop giving way of the ankle which persists after 3 to 6 months of rehabilitation. EXERCISES  RANGE OF MOTION AND STRETCHING EXERCISES - Ankle Instability, Chronic, Non-Surgical Intervention Since ankles demonstrate instability when they have too much motion throughout the joints, range of motion and stretching exercises are not helpful and can even be harmful. Only complete range of motion and stretching exercises for your ankle if instructed by your physician, physical therapist or athletic trainer. An effective rehabilitation program for unstable ankles will include mostly strengthening and balance exercises. STRENGTHENING EXERCISES - Ankle Instability, Chronic, Non-Surgical Intervention  These exercises may help you when beginning to rehabilitate your injury. They may resolve your symptoms with or without further involvement from your physician, physical therapist or athletic trainer. While completing these exercises, remember:  Muscles can gain both the endurance and the strength needed for everyday activities through controlled  exercises.  Complete these exercises as instructed by your physician, physical therapist or athletic trainer. Progress the resistance and repetitions only as guided.  You may experience muscle soreness or fatigue, but the pain or discomfort you are trying to eliminate should never worsen during these exercises. If this pain does worsen, stop and make certain you are following the directions exactly. If the pain is still present after adjustments, discontinue the exercise until you can discuss the trouble with your clinician. STRENGTH - Dorsiflexors  Secure a rubber exercise band/tubing to a fixed object (table, pole) and loop the other end around your right / left foot.  Sit on the floor facing the fixed object. The band/tubing should be slightly tense when your foot is relaxed.  Slowly draw your foot back toward you using your ankle and toes.  Hold this position for __________ seconds. Slowly release the tension in the band and return your foot to the starting position. Repeat __________ times. Complete this exercise __________ times per day.  STRENGTH - Plantar-flexors  Sit with your right / left leg extended. Holding onto both ends of a rubber exercise band/tubing, loop it around the ball of your foot. Keep a slight tension in the band.  Slowly push your toes away from you, pointing them downward.  Hold this position for __________ seconds. Return slowly, controlling the tension in the band/tubing. Repeat __________ times. Complete this exercise __________ times per day.  STRENGTH - Plantar-flexors, Standing   Stand with your feet shoulder width apart. Steady yourself with a wall or table using as little support as needed.  Keeping your weight evenly spread over the width of your feet, rise up on your toes.*  Hold this position for __________ seconds. Repeat __________ times. Complete this  exercise __________ times per day.  *If this is too easy, shift your weight toward your right  / left leg until you feel challenged. Ultimately, you may be asked to do this exercise with your right / left foot only. STRENGTH - Ankle Eversion  Secure one end of a rubber exercise band/tubing to a fixed object (table, pole). Loop the other end around your foot just before your toes.  Place your fists between your knees. This will focus your strengthening at your ankle.  Drawing the band/tubing across your opposite foot, slowly, pull your little toe out and up. Make sure the band/tubing is positioned to resist the entire motion.  Hold this position for __________ seconds.  Have your muscles resist the band/tubing as it slowly pulls your foot back to the starting position. Repeat __________ times. Complete this exercise __________ times per day.  STRENGTH - Ankle Inversion  Secure one end of a rubber exercise band/tubing to a fixed object (table, pole). Loop the other end around your foot just before your toes.  Place your fists between your knees. This will focus your strengthening at your ankle.  Slowly, pull your big toe up and in, making sure the band/tubing is positioned to resist the entire motion.  Hold this position for __________ seconds.  Have your muscles resist the band/tubing as it slowly pulls your foot back to the starting position. Repeat __________ times. Complete this exercises __________ times per day.  STRENGTH - Towel Curls  Sit in a chair positioned on a non-carpeted surface.  Place your foot on a towel, keeping your heel on the floor.  Pull the towel toward your heel by only curling your toes. Keep your heel on the floor.  If instructed by your physician, physical therapist or athletic trainer, add weight to the end of the towel. Repeat __________ times. Complete this exercise __________ times per day. STRENGTH - Dorsiflexors and Plantar-flexors, Heel/toe Walking  Dorsiflexion: Walk on your heels only. Keep your toes as high as possible.  Repeat  __________ times. Complete __________ times per day.  Plantar flexion: Walk on your toes only. Keep your heels as high as possible.  Walk for ____________________ seconds/feet. Repeat __________ times. Complete __________ times per day.  BALANCE - Tandem Walking  Place your uninjured foot on a line 2-4 inches wide and at least 10 feet long.  Keeping your balance without using anything for extra support, place your right / left heel directly in front of your other foot.  Slowly raise your back foot up, lifting from the heel to the toes, and place it directly in front of the right / left foot.  Continue to walk along the line slowly. Walk for ____________________ feet. Repeat ____________________ times. Complete ____________________ times per day. BALANCE - Inversion/Eversion Use caution, these are advanced level exercises. Do not begin them until you are advised to do so.   Create a balance board using a sturdy board about 1  feet long and at 1-1  feet wide and a 1  inch diameter rod or pipe that is as long as the board's width. A copper pipe or a solid broomstick work well.  Stand on a non-carpeted surface near a countertop or wall. Step onto the board so that your feet are hip-width apart and equally straddle the rod/pipe.  Keeping your feet in place, complete these two exercises without shifting your upper body or hips:  Tip the board from side-to-side. Control the movement so the board does not  forcefully strike the ground. The board should silently tap the ground.  Tip the board side-to-side without striking the ground. Occasionally pause and maintain a steady position at various points.  Repeat the first two exercises, but use only your right / left foot. Place your right / left foot directly over the rod/pipe. Repeat __________ times. Complete this exercise __________ times a day. BALANCE - Plantar/Dorsi Flexion Use caution, these are advanced level exercises. Do not begin  them until you are advised to do so.  Create a balance board using a sturdy board about 1  feet long and at 1-1  feet wide and a 1  inch diameter rod or pipe that is as long as the board's width. A copper pipe or a solid broomstick work well.  Stand on a non-carpeted surface near a countertop or wall. Stand on the board so that the rod/pipe runs under the arches in your feet.  Keeping your feet in place, complete these two exercises without shifting your upper body or hips:  Tip the board from side-to-side. Control the movement so the board does not forcefully strike the ground. The board should silently tap the ground.  Tip the board side-to-side without striking the ground. Occasionally pause and maintain a steady position at various points.  Repeat the first two exercises, but use only your right / left foot. Stand in the center of the board. Repeat __________ times. Complete this exercise __________ times a day. STRENGTH - Plantar-flexors, Eccentric Note: This exercise can place a lot of stress on your foot and ankle. Please complete this exercise only if specifically instructed by your caregiver.   Place the balls of your feet on a step. With your hands, use only enough support from a wall or rail to keep your balance.  Keep your knees straight and rise up on your toes.  Slowly shift your weight entirely to your toes and pick up your opposite foot. Gently and with controlled movement, lower your weight through your right / left foot so that your heel drops below the level of the step. You will feel a slight stretch in the back of your calf at the ending position.  Use the healthy leg to help rise up onto the balls of both feet, then lower weight only on the right / left leg again. Build up to 15 repetitions. Then progress to 3 consecutive sets of 15 repetitions.*  After completing the above exercise, complete the same exercise with a slight knee bend (about 30 degrees). Again, build  up to 15 repetitions. Then progress to 3 consecutive sets of 15 repetitions.* Perform this exercise __________ times per day.  *When you easily complete 3 sets of 15, your physician, physical therapist or athletic trainer may advise you to add resistance by wearing a backpack filled with additional weight. Document Released: 03/01/2005 Document Revised: 10/23/2011 Document Reviewed: 11/12/2008 [facility] Patient Information 2015 [place], [place]. This information is not intended to replace advice given to you by your health care provider. Make sure you discuss any questions you have with your health care provider.

## 2014-04-04 NOTE — ED Provider Notes (Signed)
CSN: [id number]     Arrival date & time 04/04/14  1622 History  This chart was scribed for non-physician practitioner, Fayrene Helper, PA-C working with Hurman Horn, MD by Greggory Stallion, ED scribe. This patient was seen in room WTR7/WTR7 and the patient's care was started at 5:16 PM.   Chief Complaint  Patient presents with  . Ankle Pain   The history is provided by the patient. No language interpreter was used.   HPI Comments: Alicia Morgan is a 23 y.o. female who presents to the Emergency Department complaining of left ankle pain that recently started. Reports associated swelling and tingling. She states she had surgery to repair a tibial fracture in 2005 but hasn't had problems with it until about 3 years ago. 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 Pt states that she is having increased popping and weakness in her ankle when standing for long periods of time. Bearing weight worsens the pain. Denies fever. No recent injury, no knee or hip pain.  No rash. No specific treatment tried.  Orthopedist at [facility]   History reviewed. No pertinent past medical history. Past Surgical History  Procedure Laterality Date  . Open reduction and internal fixation of ankle fracture.     No family history on file. History  Substance Use Topics  . Smoking status: Current Every Day Smoker    Types: Cigars  . Smokeless tobacco: Not on file  . Alcohol Use: Yes   OB History   Grav Para Term Preterm Abortions TAB SAB Ect Mult Living                 Review of Systems  Constitutional: Negative for fever.  Musculoskeletal: Positive for arthralgias and joint swelling.  Neurological: Positive for weakness.  All other systems reviewed and are negative.  Allergies  Review of patient's allergies indicates no known allergies.  Home Medications   Prior to Admission medications   Medication Sig Start  Date End Date Taking? Authorizing Provider  ibuprofen (ADVIL,MOTRIN) 200 MG tablet Take 400 mg by mouth every 6 (six) hours as needed for moderate pain.   Yes Historical Provider, MD   BP 128/73  Pulse 75  Temp(Src) 98.5 F (36.9 C) (Oral)  Resp 16  SpO2 100%  LMP 03/13/2014  Physical Exam  Nursing note and vitals reviewed. Constitutional: She is oriented to person, place, and time. She appears well-developed and well-nourished. No distress.  HENT:  Head: Normocephalic and atraumatic.  Eyes: Conjunctivae and EOM are normal.  Neck: Neck supple. No tracheal deviation present.  Cardiovascular: Normal rate.   Left foot dorsalis pedis pulse palpable.  Pulmonary/Chest: Effort normal. No respiratory distress.  Musculoskeletal: Normal range of motion.  Tenderness to medial malleolar region. Tenderness to mid dorsum of foot. Brisk capillary refill. Sensation intact theroughout. Well healing surgical scar medially. No evidence of infection. Ankle with full ROM.   Neurological: She is alert and oriented to person, place, and time.  Skin: Skin is warm and dry.  Psychiatric: She has a normal mood and affect. Her behavior is normal.    ED Course  Procedures (including critical care time)  DIAGNOSTIC STUDIES: Oxygen Saturation is 100% on RA, normal by my interpretation.    COORDINATION OF CARE: 5:21 PM-Discussed treatment plan which includes xray with pt at bedside and pt agreed to plan. Advised pt that is xray is negative, she will  be discahrged home with an ankle brace and orthopedic referral.   6:24 PM Xray neg for acute fx/dislocation.  Reassurance given.  RICE therapy discussed.  Pt to f/u with ortho for further care. Work note provided as request.   Labs Review Labs Reviewed - No data to display  Imaging Review Dg Ankle Complete Left  04/04/2014   CLINICAL DATA:  Worsening ankle pain and popping since remote left ankle surgery  EXAM: LEFT ANKLE COMPLETE - 3+ VIEW  COMPARISON:   Ankle series of October 19, 2004  FINDINGS: The ankle joint mortise is preserved. The talar dome is intact. There is mild degenerative change of the distal tibiofibular articulation. The patient has undergone previous ORIF for a medial malleolar fracture. The talar dome is intact. The calcaneus is normal in appearance. There is irregularity of the dorsal aspect of the tarsal navicular which may be degenerative or posttraumatic.  IMPRESSION: There is no acute bony abnormality of the left ankle. There are mild degenerative and postsurgical changes.   Electronically Signed   By: David  [place]   On: 04/04/2014 17:43     EKG Interpretation None      MDM   Final diagnoses:  Chronic ankle pain, left    BP 128/73  Pulse 75  Temp(Src) 98.5 F (36.9 C) (Oral)  Resp 16  SpO2 100%  LMP 03/13/2014   I personally performed the services described in this documentation, which was scribed in my presence. The recorded information has been reviewed and is accurate.  Fayrene Helper, PA-C 04/04/14 1824

## 2014-04-04 NOTE — ED Notes (Signed)
Ortho tech at bedside 

## 2014-04-04 NOTE — ED Notes (Signed)
Pt from home reports that she had ankle fx in 2005 with pin and screw in place. Pt reports no injury, but has increased "popping" in the L ankle, with tingling to L leg. Pt reports weakness in L ankle when she gets tired and ankle "turns out." Pt is A&O and in NAD

## 2014-04-14 NOTE — ED Provider Notes (Signed)
Medical screening examination/treatment/procedure(s) were performed by non-physician practitioner and as supervising physician I was immediately available for consultation/collaboration.   EKG Interpretation None       Sadat Sliwa M Seibert Keeter, MD 04/14/14 1522 

## 2014-05-26 ENCOUNTER — Emergency Department (HOSPITAL_COMMUNITY)
Admission: EM | Admit: 2014-05-26 | Discharge: 2014-05-26 | Discharge status: Against Medical Advice | Payer: Medicaid Other | Attending: Emergency Medicine | Admitting: Emergency Medicine

## 2014-05-26 ENCOUNTER — Encounter (HOSPITAL_COMMUNITY): Payer: Self-pay | Admitting: Emergency Medicine

## 2014-05-26 DIAGNOSIS — R102 Pelvic and perineal pain: Secondary | ICD-10-CM | POA: Insufficient documentation

## 2014-05-26 DIAGNOSIS — Z720 Tobacco use: Secondary | ICD-10-CM | POA: Insufficient documentation

## 2014-05-26 NOTE — ED Notes (Signed)
Pt has been called twice at 2150 and 2202 with no response

## 2014-05-26 NOTE — ED Notes (Signed)
Pt called from triage, no answer 

## 2014-05-26 NOTE — ED Notes (Signed)
Pt reports noticing painful sores around her vagina and inner thighs Monday.

## 2014-06-18 ENCOUNTER — Emergency Department (HOSPITAL_COMMUNITY): Payer: Medicaid Other

## 2014-06-18 ENCOUNTER — Emergency Department (HOSPITAL_COMMUNITY)
Admission: EM | Admit: 2014-06-18 | Discharge: 2014-06-18 | Disposition: A | Discharge status: Home / Self Care | Payer: Medicaid Other | Attending: Emergency Medicine | Admitting: Emergency Medicine

## 2014-06-18 ENCOUNTER — Encounter (HOSPITAL_COMMUNITY): Payer: Self-pay | Admitting: Emergency Medicine

## 2014-06-18 DIAGNOSIS — B9689 Other specified bacterial agents as the cause of diseases classified elsewhere: Secondary | ICD-10-CM

## 2014-06-18 DIAGNOSIS — R110 Nausea: Secondary | ICD-10-CM

## 2014-06-18 DIAGNOSIS — O210 Mild hyperemesis gravidarum: Secondary | ICD-10-CM | POA: Insufficient documentation

## 2014-06-18 DIAGNOSIS — O23591 Infection of other part of genital tract in pregnancy, first trimester: Secondary | ICD-10-CM | POA: Insufficient documentation

## 2014-06-18 DIAGNOSIS — N760 Acute vaginitis: Secondary | ICD-10-CM

## 2014-06-18 DIAGNOSIS — F17210 Nicotine dependence, cigarettes, uncomplicated: Secondary | ICD-10-CM | POA: Insufficient documentation

## 2014-06-18 DIAGNOSIS — Z791 Long term (current) use of non-steroidal anti-inflammatories (NSAID): Secondary | ICD-10-CM | POA: Diagnosis not present

## 2014-06-18 DIAGNOSIS — O99331 Smoking (tobacco) complicating pregnancy, first trimester: Secondary | ICD-10-CM | POA: Diagnosis not present

## 2014-06-18 DIAGNOSIS — Z3490 Encounter for supervision of normal pregnancy, unspecified, unspecified trimester: Secondary | ICD-10-CM

## 2014-06-18 DIAGNOSIS — O9989 Other specified diseases and conditions complicating pregnancy, childbirth and the puerperium: Secondary | ICD-10-CM | POA: Diagnosis not present

## 2014-06-18 DIAGNOSIS — Z3A01 Less than 8 weeks gestation of pregnancy: Secondary | ICD-10-CM | POA: Insufficient documentation

## 2014-06-18 LAB — POC URINE PREG, ED: PREG TEST UR: POSITIVE — AB

## 2014-06-18 LAB — BASIC METABOLIC PANEL
Anion gap: 13 (ref 5–15)
BUN: 8 mg/dL (ref 6–23)
CO2: 21 mEq/L (ref 19–32)
CREATININE: 0.6 mg/dL (ref 0.50–1.10)
Calcium: 9.3 mg/dL (ref 8.4–10.5)
Chloride: 98 mEq/L (ref 96–112)
GFR calc Af Amer: >90 mL/min (ref >90)
GLUCOSE: 91 mg/dL (ref 70–99)
POTASSIUM: 4 meq/L (ref 3.7–5.3)
Sodium: 132 mEq/L — ABNORMAL LOW (ref 137–147)

## 2014-06-18 LAB — WET PREP, GENITAL
Trich, Wet Prep: NONE SEEN
YEAST WET PREP: NONE SEEN

## 2014-06-18 LAB — CBC WITH DIFFERENTIAL/PLATELET
BASOS PCT: 0 % (ref 0–1)
Basophils Absolute: 0 10*3/uL (ref 0.0–0.1)
EOS ABS: 0.2 10*3/uL (ref 0.0–0.7)
EOS PCT: 2 % (ref 0–5)
HEMATOCRIT: 35.1 % — AB (ref 36.0–46.0)
HEMOGLOBIN: 11.8 g/dL — AB (ref 12.0–15.0)
LYMPHS ABS: 3.3 10*3/uL (ref 0.7–4.0)
Lymphocytes Relative: 42 % (ref 12–46)
MCH: 28.5 pg (ref 26.0–34.0)
MCHC: 33.6 g/dL (ref 30.0–36.0)
MCV: 84.8 fL (ref 78.0–100.0)
MONO ABS: 0.6 10*3/uL (ref 0.1–1.0)
MONOS PCT: 8 % (ref 3–12)
Neutro Abs: 3.7 10*3/uL (ref 1.7–7.7)
Neutrophils Relative %: 48 % (ref 43–77)
Platelets: 316 10*3/uL (ref 150–400)
RBC: 4.14 MIL/uL (ref 3.87–5.11)
RDW: 13.3 % (ref 11.5–15.5)
WBC: 7.7 10*3/uL (ref 4.0–10.5)

## 2014-06-18 LAB — RPR

## 2014-06-18 LAB — HCG, QUANTITATIVE, PREGNANCY: hCG, Beta Chain, Quant, S: 21903 m[IU]/mL — ABNORMAL HIGH (ref <5)

## 2014-06-18 LAB — RAPID HIV SCREEN (WH-MAU): Rapid HIV Screen: NONREACTIVE

## 2014-06-18 MED ORDER — PRENATAL COMPLETE 14-0.4 MG PO TABS: 1.0000 | ORAL_TABLET | Freq: Every day | ORAL | Status: DC

## 2014-06-18 MED ORDER — METRONIDAZOLE 500 MG PO TABS: 500.0000 mg | ORAL_TABLET | Freq: Two times a day (BID) | ORAL | Status: DC

## 2014-06-18 MED ORDER — ONDANSETRON 4 MG PO TBDP
4.0000 mg | ORAL_TABLET | Freq: Once | ORAL | Status: AC
  Administered 2014-06-18: 4 mg via ORAL
  Filled 2014-06-18: qty 1

## 2014-06-18 MED ORDER — ONDANSETRON HCL 4 MG PO TABS: 4.0000 mg | ORAL_TABLET | Freq: Four times a day (QID) | ORAL | Status: DC

## 2014-06-18 NOTE — Discharge Instructions (Signed)
Take the prescribed medication as directed. Follow-up with your OB-GYN-- schedule appt as soon as possible for ongoing prenatal care. Return to the ED for new or worsening symptoms.

## 2014-06-18 NOTE — ED Provider Notes (Signed)
CSN: [id number]     Arrival date & time 06/18/14  [id number] History   First MD Initiated Contact with Patient 06/18/14 1037     Chief Complaint  Patient presents with  . Nausea     (Consider location/radiation/quality/duration/timing/severity/associated sxs/prior Treatment) The history is provided by the patient and medical records.    This is a 23 y.o. F with no significant PMH presenting to the ED for nausea, vomiting, and increased belching for the past 24 hours.  Patient states she is able to control her symptoms by brushing her teeth and tongue vigorously.  Patient also notes vaginal discharge and vaginal irritation.  There is some odor with discharge.  Patient denies new sexual partners but does have some concern regarding possibly infidelities.  Patient states she gets yearly STD screenings at the health dept.  Patient states LMP was 05/11/14, not currently on any form of birth control.  States she uses the "calander method" which works well for her.  Prior pregnancy in 2010.   Denies current abdominal pain, vaginal bleeding, or loss of fluids.  No fever, chills, urinary sx.  VS stable on arrival. OB-GYN is [facility]  History reviewed. No pertinent past medical history. Past Surgical History  Procedure Laterality Date  . Open reduction and internal fixation of ankle fracture.    . Fracture surgery      L tibia    No family history on file. History  Substance Use Topics  . Smoking status: Current Every Day Smoker    Types: Cigars  . Smokeless tobacco: Not on file  . Alcohol Use: Yes   OB History    No data available     Review of Systems  Gastrointestinal: Positive for nausea and vomiting.  All other systems reviewed and are negative.     Allergies  Review of patient's allergies indicates no known allergies.  Home Medications   Prior to Admission medications   Medication Sig Start Date End Date Taking? Authorizing Provider  ibuprofen (ADVIL,MOTRIN) 800 MG  tablet Take 1 tablet (800 mg total) by mouth 3 (three) times daily. 04/04/14   Fayrene HelperBowie Tran, PA-C   BP 133/60 mmHg  Pulse 70  Temp(Src) 97.9 F (36.6 C) (Oral)  Resp 20  SpO2 100%  LMP 06/13/2014   Physical Exam  Constitutional: She is oriented to person, place, and time. She appears well-developed and well-nourished. No distress.  HENT:  Head: Normocephalic and atraumatic.  Mouth/Throat: Oropharynx is clear and moist.  Eyes: Conjunctivae and EOM are normal. Pupils are equal, round, and reactive to light.  Neck: Normal range of motion. Neck supple.  Cardiovascular: Normal rate, regular rhythm and normal heart sounds.   Pulmonary/Chest: Effort normal and breath sounds normal. No respiratory distress. She has no wheezes.  Abdominal: Soft. Bowel sounds are normal. There is no tenderness. There is no guarding.  Abdomen soft, nondistended, no focal tenderness or peritoneal signs  Genitourinary: There is no lesion on the right labia. There is no lesion on the left labia. Cervix exhibits no motion tenderness. Right adnexum displays no tenderness. Left adnexum displays no tenderness. No bleeding in the vagina. No foreign body around the vagina. Vaginal discharge found.  Normal female external genitalia without visible lesions, moderate amount of malodorous white discharge present, no vaginal bleeding, cervical os closed; no adnexal or cervical motion tenderness  Musculoskeletal: Normal range of motion.  Neurological: She is alert and oriented to person, place, and time.  Skin: Skin is warm and dry.  She is not diaphoretic.  Psychiatric: She has a normal mood and affect.  Nursing note and vitals reviewed.   ED Course  Procedures (including critical care time) Labs Review Labs Reviewed  WET PREP, GENITAL - Abnormal; Notable for the following:    Clue Cells Wet Prep HPF POC MODERATE (*)    WBC, Wet Prep HPF POC FEW (*)    All other components within normal limits  HCG, QUANTITATIVE, PREGNANCY  - Abnormal; Notable for the following:    hCG, Beta Chain, Quant, S 21903 (*)    All other components within normal limits  CBC WITH DIFFERENTIAL - Abnormal; Notable for the following:    Hemoglobin 11.8 (*)    HCT 35.1 (*)    All other components within normal limits  BASIC METABOLIC PANEL - Abnormal; Notable for the following:    Sodium 132 (*)    All other components within normal limits  POC URINE PREG, ED - Abnormal; Notable for the following:    Preg Test, Ur POSITIVE (*)    All other components within normal limits  GC/CHLAMYDIA PROBE AMP  RAPID HIV SCREEN ([facility]-MAU)  RPR    Imaging Review US Ob Comp Less 14 Wks  06/18/2014   CLINICAL DATA:  Nausea; positive beta HCG value of 21,903.  EXAM: OBSTETRIC <14 WK [place] AND TRANSVAGINAL OB US  TECHNIQUE: Both transabdominal and transvaginal ultrasound examinations were performed for complete evaluation of the gestation as well as the maternal uterus, adnexal regions, and pelvic cul-de-sac. Transvaginal technique was performed to assess early pregnancy.  COMPARISON:  None.  FINDINGS: Intrauterine gestational sac: Single, normal in shape  Yolk sac:  Present  Embryo:  Present  Cardiac Activity: Present  Heart Rate:  133 bpm  CRL:   94  mm   7 w 0 d                  [place] EDC: February 04, 2015  Maternal uterus/adnexae: No subchorionic hemorrhage is demonstrated. The right ovary contains an irregularly marginated mixed echogenicity structure measuring 2.5 x 2 x 2.2 cm. It does not demonstrate a "ring of fire" or other abnormal vascular pattern. The left ovary could not be visualized.  IMPRESSION: 1. There is a viable IUP with estimated gestational age of [redacted] weeks 0 days. This is approximately 11 days more mature than that predicted by menstrual dating. Follow-up ultrasound at 20-22 weeks is recommended for fetal anatomic evaluation. 2. There is an irregular presumed collapsing cyst in the right ovary measuring 2.5 cm in greatest dimension. Follow-up pelvic  ultrasound in 4-6 weeks would be useful to reassess the right adnexa.   Electronically Signed   By: David  [place]   On: 06/18/2014 13:12   US Ob Transvaginal  06/18/2014   CLINICAL DATA:  Nausea; positive beta HCG value of 21,903.  EXAM: OBSTETRIC <14 WK [place] AND TRANSVAGINAL OB US  TECHNIQUE: Both transabdominal and transvaginal ultrasound examinations were performed for complete evaluation of the gestation as well as the maternal uterus, adnexal regions, and pelvic cul-de-sac. Transvaginal technique was performed to assess early pregnancy.  COMPARISON:  None.  FINDINGS: Intrauterine gestational sac: Single, normal in shape  Yolk sac:  Present  Embryo:  Present  Cardiac Activity: Present  Heart Rate:  133 bpm  CRL:   94  mm   7 w 0 d                  [place] [facility]: February 04, 2015  Maternal uterus/adnexae: No subchorionic hemorrhage is demonstrated. The right ovary contains an irregularly marginated mixed echogenicity structure measuring 2.5 x 2 x 2.2 cm. It does not demonstrate a "ring of fire" or other abnormal vascular pattern. The left ovary could not be visualized.  IMPRESSION: 1. There is a viable IUP with estimated gestational age of [redacted] weeks 0 days. This is approximately 11 days more mature than that predicted by menstrual dating. Follow-up ultrasound at 20-22 weeks is recommended for fetal anatomic evaluation. 2. There is an irregular presumed collapsing cyst in the right ovary measuring 2.5 cm in greatest dimension. Follow-up pelvic ultrasound in 4-6 weeks would be useful to reassess the right adnexa.   Electronically Signed   By: David  [place]   On: 06/18/2014 13:12     EKG Interpretation None      MDM   Final diagnoses:  Pregnancy  Nausea  Bacterial vaginosis   23 year old female with nausea and vomiting for the past day. Urine pregnancy test positive. Patient G2P1. Abdominal exam benign.  Pelvic exam was performed, malodorous discharge noted without adnexal or cervical motion tenderness. Cervical  os closed, no bleeding. Wet prep with moderate clue cells.  Rapid HIV negative.  Gc/Chl and RPR pending. Lab work is reassuring.  Ultrasound revealing IUP approx [redacted] weeks gestation.  Patient will be treated for BV, started on pre-natal vitamins.  Encouraged close FU with her OB-GYN for ongoing prenatal care.  Discussed plan with patient, he/she acknowledged understanding and agreed with plan of care.  Return precautions given for new or worsening symptoms.  Garlon HatchetLisa M Taseen Marasigan, PA-C 06/18/14 1511  Warnell Foresterrey Wofford, MD 06/18/14 [phone]

## 2014-06-18 NOTE — ED Notes (Signed)
Per pt, states nausea and vaginal issues for over 2 weeks-states unprotected sex 2 weeks ago

## 2014-06-18 NOTE — ED Notes (Signed)
Patient transported to Ultrasound 

## 2014-06-19 LAB — GC/CHLAMYDIA PROBE AMP
CT Probe RNA: POSITIVE — AB
GC Probe RNA: NEGATIVE

## 2014-06-20 ENCOUNTER — Telehealth: Payer: Self-pay | Admitting: Emergency Medicine

## 2014-06-20 NOTE — Telephone Encounter (Signed)
Positive Chlamydia culture Chart sent to EDP for review 

## 2014-06-22 ENCOUNTER — Telehealth (HOSPITAL_COMMUNITY): Payer: Self-pay

## 2014-06-22 NOTE — ED Notes (Signed)
Chart returned from ED office. Attempted contact x1. No answer. Pt needs cefimime 400mg  po x 1 and azithromycin 1 gram po x 1 ordered by Blondell RevealM Gentry

## 2014-06-23 ENCOUNTER — Telehealth (HOSPITAL_COMMUNITY): Payer: Self-pay

## 2014-06-24 ENCOUNTER — Telehealth: Payer: Self-pay | Admitting: Emergency Medicine

## 2014-07-03 ENCOUNTER — Telehealth: Payer: Self-pay | Admitting: Emergency Medicine

## 2014-07-03 NOTE — Telephone Encounter (Signed)
pt returned call after receiving letter. Notified of positive Chlamydia and need for antibiotics. RX Zithromax and Cefixime called to [facility].

## 2014-07-06 NOTE — ED Notes (Signed)
Pt came back to ED today, spoke with charge, pt was seen and treated on 11/5, pt was given 3 prescriptions, reports she dropped them off at [facility], but did not get them filled because at the time she did not have medicaid, and at one point the medications were going to cost $42. Pt said with medicaid"her medications should be free". rn tried to explain copays. Pt came to ED asking if a doctor could rewrite the prescriptions and for generics. rn explained the prescriptions were generic. Charge went through chart and saw note that after tests came back pt was prescribed and called in zithromax and cefixime. Charge called [facility] on [address] and verified what prescriptions pt had on file. Pt has zithromax and cefixime on file and both medications are $3 a piece. Pharmacy reported pt did not have any other prescriptions on file for the flagyl, zofran, or prenatal. Pharmacy reported that if pt had dropped off the prescriptions there would be a paper trail and there was no such documentation.  Charge talked with md ray today, and md ray stated pt should follow up with her OBGYN to see if medications prescribed on 11/5 were still needed and for pt to have further evaluation. And that md ray would not rewrite prescriptions without the pt being seen.  Pt also wanted to ask if case management could help her with the cost of her prescriptions. Charge asked case management to speak with pt, case management explained to pt there is one program if the patient is eligible but pt would still have to pay $3 per prescription, so the program would not apply to her current prescriptions. Pt had called her OBGYN and was asking for an appointment today to see provider for evaluation. Pt lef the ED and was waiting for call back from OBGYN.

## 2014-07-06 NOTE — Progress Notes (Signed)
  CARE MANAGEMENT ED NOTE 07/06/2014  Patient:  Alicia Morgan, Alicia Morgan   Account Number:  [id number]  Date Initiated:  07/06/2014  Documentation initiated by:  Jackelyn Poling  Subjective/Objective Assessment:   23 yr old self pay [place] patient (who previously had medicaid but is not active per Registration check on 06/18/14 when pt seen at [facility]) On 07/06/14 Pt wanting 06/18/14 rx for flaygl to be re issued by 07/06/14 EDPs     Subjective/Objective Assessment Detail:   With EPIC note review Noted 3 ED visits, no admissions in last 6 months  last seen on 06/18/14 in [facility] ED  This CM saw pt in 06/2012 and educated pt on medicaid process to keep card active.  At that time she reported receiving Medicaid for a 23 yr old but did not have her card on the day Cm saw her    Pt informed Rn the health dept gives out free  medications Cm is not aware of a program  No pcp  CM & charge RN met pt in [facility] ED lobby while she was speaking with someone "at [facility]" via her cell phone States the office is close to [place] tells CM that she did not get Flagyl after ED d/c because of Not having any money, medicaid is not active nor having support of family, friends or local church to get funds  No one knows where the 06/18/14  Flaygl Rx is located. EDPs will not write for flagyl if pt has not been evaluated by them  Pt states she does not have $3 for each Rx that can be offered via [facility] assistance Requested money for Rx copay. Cm explained to her that there is not a [facility] program to offer copays.  Pt states "I guess I will wait then"  Pt and a teen age female walked out the [facility] ED entrance sliding doors     Action/Plan:   ED Cm consulted by ED charge RN when pt arrived at ED requesting Rx for Flagyl Per charge RN pt pharmacy states no Rx for Flagyl Rx given to them per their files. Charge Rn reviewed pt concerns & repoorts recommending pt be seen   Action/Plan Detail:   by OB GYN for Flagyl Rx written 06/18/14 Cm also  recommended pt go to OB GYN to get assist CM discussed MATCH critera Encouraged family, friends and local churches   Anticipated DC Date:  07/06/2014     Status Recommendation to Physician:   Result of Recommendation:    Other ED Services  Consult [facility] [place]  Other  Outpatient Services - Pt will follow up  PCP issues  Medication Assistance    Choice offered to / List presented to:            Status of service:  Completed, signed off  ED Comments:   ED Comments Detail:  CM spoke with the pt about [facility] MATCH program ($3 co pay for each Rx through [facility] program, does not include refills, 7 day expiration of [place] letter and choice of pharmacies) Letter refused

## 2014-07-10 LAB — OB RESULTS CONSOLE HIV ANTIBODY (ROUTINE TESTING): HIV: NONREACTIVE
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
## 2014-09-09 ENCOUNTER — Encounter (HOSPITAL_COMMUNITY): Payer: Self-pay | Admitting: *Deleted

## 2014-09-09 ENCOUNTER — Inpatient Hospital Stay (HOSPITAL_COMMUNITY)
Admission: AD | Admit: 2014-09-09 | Discharge: 2014-09-09 | Disposition: A | Discharge status: Home / Self Care | Payer: Medicaid Other | Source: Ambulatory Visit | Attending: Obstetrics and Gynecology | Admitting: Obstetrics and Gynecology

## 2014-09-09 DIAGNOSIS — O1202 Gestational edema, second trimester: Secondary | ICD-10-CM | POA: Insufficient documentation

## 2014-09-09 DIAGNOSIS — F17290 Nicotine dependence, other tobacco product, uncomplicated: Secondary | ICD-10-CM | POA: Insufficient documentation

## 2014-09-09 DIAGNOSIS — Z3A18 18 weeks gestation of pregnancy: Secondary | ICD-10-CM | POA: Insufficient documentation

## 2014-09-09 DIAGNOSIS — M7989 Other specified soft tissue disorders: Secondary | ICD-10-CM

## 2014-09-09 DIAGNOSIS — O99332 Smoking (tobacco) complicating pregnancy, second trimester: Secondary | ICD-10-CM | POA: Insufficient documentation

## 2014-09-09 LAB — CBC WITH DIFFERENTIAL/PLATELET
BASOS PCT: 0 % (ref 0–1)
Basophils Absolute: 0 10*3/uL (ref 0.0–0.1)
Eosinophils Absolute: 0.3 10*3/uL (ref 0.0–0.7)
Eosinophils Relative: 3 % (ref 0–5)
HCT: 30.8 % — ABNORMAL LOW (ref 36.0–46.0)
Hemoglobin: 10.5 g/dL — ABNORMAL LOW (ref 12.0–15.0)
Lymphocytes Relative: 35 % (ref 12–46)
Lymphs Abs: 3.3 10*3/uL (ref 0.7–4.0)
MCH: 28.5 pg (ref 26.0–34.0)
MCHC: 34.1 g/dL (ref 30.0–36.0)
MCV: 83.5 fL (ref 78.0–100.0)
MONOS PCT: 8 % (ref 3–12)
Monocytes Absolute: 0.7 10*3/uL (ref 0.1–1.0)
NEUTROS ABS: 5.2 10*3/uL (ref 1.7–7.7)
NEUTROS PCT: 54 % (ref 43–77)
Platelets: 256 10*3/uL (ref 150–400)
RBC: 3.69 MIL/uL — ABNORMAL LOW (ref 3.87–5.11)
RDW: 13.3 % (ref 11.5–15.5)
WBC: 9.5 10*3/uL (ref 4.0–10.5)

## 2014-09-09 LAB — COMPREHENSIVE METABOLIC PANEL
ALT: 15 U/L (ref 0–35)
ANION GAP: 7 (ref 5–15)
AST: 17 U/L (ref 0–37)
Albumin: 3.2 g/dL — ABNORMAL LOW (ref 3.5–5.2)
Alkaline Phosphatase: 43 U/L (ref 39–117)
BUN: 5 mg/dL — ABNORMAL LOW (ref 6–23)
CO2: 24 mmol/L (ref 19–32)
Calcium: 8.5 mg/dL (ref 8.4–10.5)
Chloride: 106 mmol/L (ref 96–112)
Creatinine, Ser: 0.39 mg/dL — ABNORMAL LOW (ref 0.50–1.10)
GFR calc Af Amer: >90 mL/min (ref >90)
GFR calc non Af Amer: >90 mL/min (ref >90)
GLUCOSE: 78 mg/dL (ref 70–99)
Potassium: 3.8 mmol/L (ref 3.5–5.1)
SODIUM: 137 mmol/L (ref 135–145)
Total Bilirubin: 0.4 mg/dL (ref 0.3–1.2)
Total Protein: 6.9 g/dL (ref 6.0–8.3)

## 2014-09-09 LAB — PROTEIN / CREATININE RATIO, URINE
Creatinine, Urine: 199 mg/dL
PROTEIN CREATININE RATIO: 0.06 (ref 0.00–0.15)
Total Protein, Urine: 11 mg/dL

## 2014-09-09 LAB — URIC ACID: Uric Acid, Serum: 3.5 mg/dL (ref 2.4–7.0)

## 2014-09-09 LAB — LACTATE DEHYDROGENASE: LDH: 152 U/L (ref 94–250)

## 2014-09-09 MED ORDER — ACETAMINOPHEN 325 MG PO TABS
650.0000 mg | ORAL_TABLET | Freq: Once | ORAL | Status: AC
  Administered 2014-09-09: 650 mg via ORAL
  Filled 2014-09-09: qty 2

## 2014-09-09 MED ORDER — FERROUS SULFATE 325 (65 FE) MG PO TBEC: 325.0000 mg | DELAYED_RELEASE_TABLET | Freq: Two times a day (BID) | ORAL | Status: DC

## 2014-09-09 NOTE — MAU Note (Signed)
MAU Addendum Note  Results for orders placed or performed during the hospital encounter of 09/09/14 (from the past 24 hour(s))  Protein / creatinine ratio, urine     Status: None   Collection Time: 09/09/14  8:20 PM  Result Value Ref Range   Creatinine, Urine 199.00 mg/dL   Total Protein, Urine 11 mg/dL   Protein Creatinine Ratio 0.06 0.00 - 0.15  Comprehensive metabolic panel     Status: Abnormal   Collection Time: 09/09/14  9:45 PM  Result Value Ref Range   Sodium 137 135 - 145 mmol/L   Potassium 3.8 3.5 - 5.1 mmol/L   Chloride 106 96 - 112 mmol/L   CO2 24 19 - 32 mmol/L   Glucose, Bld 78 70 - 99 mg/dL   BUN 5 (L) 6 - 23 mg/dL   Creatinine, Ser 1.610.39 (L) 0.50 - 1.10 mg/dL   Calcium 8.5 8.4 - 09.610.5 mg/dL   Total Protein 6.9 6.0 - 8.3 g/dL   Albumin 3.2 (L) 3.5 - 5.2 g/dL   AST 17 0 - 37 U/L   ALT 15 0 - 35 U/L   Alkaline Phosphatase 43 39 - 117 U/L   Total Bilirubin 0.4 0.3 - 1.2 mg/dL   GFR calc non Af Amer >90 >90 mL/min   GFR calc Af Amer >90 >90 mL/min   Anion gap 7 5 - 15  CBC with Differential/Platelet     Status: Abnormal   Collection Time: 09/09/14  9:45 PM  Result Value Ref Range   WBC 9.5 4.0 - 10.5 K/uL   RBC 3.69 (L) 3.87 - 5.11 MIL/uL   Hemoglobin 10.5 (L) 12.0 - 15.0 g/dL   HCT 04.530.8 (L) 40.936.0 - 81.146.0 %   MCV 83.5 78.0 - 100.0 fL   MCH 28.5 26.0 - 34.0 pg   MCHC 34.1 30.0 - 36.0 g/dL   RDW 91.413.3 78.211.5 - 95.615.5 %   Platelets 256 150 - 400 K/uL   Neutrophils Relative % 54 43 - 77 %   Neutro Abs 5.2 1.7 - 7.7 K/uL   Lymphocytes Relative 35 12 - 46 %   Lymphs Abs 3.3 0.7 - 4.0 K/uL   Monocytes Relative 8 3 - 12 %   Monocytes Absolute 0.7 0.1 - 1.0 K/uL   Eosinophils Relative 3 0 - 5 %   Eosinophils Absolute 0.3 0.0 - 0.7 K/uL   Basophils Relative 0 0 - 1 %   Basophils Absolute 0.0 0.0 - 0.1 K/uL  Uric acid     Status: None   Collection Time: 09/09/14  9:45 PM  Result Value Ref Range   Uric Acid, Serum 3.5 2.4 - 7.0 mg/dL  Lactate dehydrogenase     Status: None    Collection Time: 09/09/14  9:45 PM  Result Value Ref Range   LDH 152 94 - 250 U/L   anemic Pt wanted to leave prior to receiving all all results  Plan: -Rx for iron -Discussed need to follow up in office  -Bleeding and PTL Precautions -Encouraged to call if any questions or concerns arise prior to next scheduled office visit.  -Discharged to home in stable condition    Jonael Paradiso, CNM, MSN 09/09/2014. 11:47 PM

## 2014-09-09 NOTE — Discharge Instructions (Signed)
Cluster Headache Cluster headaches are recognized by their pattern of deep, intense head pain. They normally occur on one side of your head, but they may "switch sides" in subsequent episodes. Typically, cluster headaches:   Are severe in nature.   Occur repeatedly over weeks to months and are followed by periods of no headaches.   Can last from 15 minutes to 3 hours.   Occur at the same time each day, often at night.   Occur several times a day. CAUSES The exact cause of cluster headaches is not known. Alcohol use may be associated with cluster headaches. SIGNS AND SYMPTOMS   Severe pain that begins in or around your eye or temple.   One-sided head pain.   Feeling sick to your stomach (nauseous).   Sensitivity to light.   Runny nose.   Eye redness, tearing, and nasal stuffiness on the side of your head where you are experiencing pain.   Sweaty, pale skin of the face.   Droopy or swollen eyelid.   Restlessness. DIAGNOSIS  Cluster headaches are diagnosed based on symptoms and a physical exam. Your health care provider may order a CT scan or an MRI of your head or lab tests to see if your headaches are caused by other medical conditions.  TREATMENT   Medicines for pain relief and to prevent recurrent attacks. Some people may need a combination of medicines.  Oxygen for pain relief.   Biofeedback programs to help reduce headache pain.  It may be helpful to keep a headache diary. This may help you find a trend for what is triggering your headaches. Your health care provider can develop a treatment plan.  HOME CARE INSTRUCTIONS  During cluster periods:   Follow a regular sleep schedule. Do not vary the amount and time that you sleep from day to day. It is important to stay on the same schedule during a cluster period to help prevent headaches.   Avoid alcohol.   Stop smoking if you smoke.  SEEK MEDICAL CARE IF:  You have any changes from your previous  cluster headaches either in intensity or frequency.   You are not getting relief from medicines you are taking.  SEEK IMMEDIATE MEDICAL CARE IF:   You faint.   You have weakness or numbness, especially on one side of your body or face.   You have double vision.   You have nausea or vomiting that is not relieved within several hours.   You cannot keep your balance or have difficulty talking or walking.   You have neck pain or stiffness.   You have a fever. MAKE SURE YOU:  Understand these instructions.   Will watch your condition.   Will get help right away if you are not doing well or get worse. Document Released: 07/31/2005 Document Revised: 05/21/2013 Document Reviewed: 02/20/2013 ExitCare Patient Information 2015 [employer]. This information is not intended to replace advice given to you by your health care provider. Make sure you discuss any questions you have with your health care provider.  

## 2014-09-09 NOTE — MAU Note (Signed)
Pt states she has swelling in her feet and her hands and face. Pt states if she goes to sleep she when wakes up"I think I shake to wake myself awake and I get a quick migraine"

## 2014-09-09 NOTE — Progress Notes (Signed)
Pt states she has a slight headache that she rates a 5

## 2014-09-09 NOTE — MAU Provider Note (Signed)
Franshesca R Kyla BalzarineCarmichael is a 24 y.o. G1P0 at 4118.6   Weeks present to MAU c/o swelling in her hand, face and feet.  She believes she has pre eclampsia bc she "work up with a headache"   History     There are no active problems to display for this patient.   Chief Complaint  Patient presents with  . Headache  . Leg Swelling  . Facial Swelling   HPI  OB History    Gravida Para Term Preterm AB TAB SAB Ectopic Multiple Living   1               History reviewed. No pertinent past medical history.  Past Surgical History  Procedure Laterality Date  . Open reduction and internal fixation of ankle fracture.    . Fracture surgery      L tibia     History reviewed. No pertinent family history.  History  Substance Use Topics  . Smoking status: Current Every Day Smoker    Types: Cigars  . Smokeless tobacco: Not on file  . Alcohol Use: Yes    Allergies:  Allergies  Allergen Reactions  . Latex     rash    Prescriptions prior to admission  Medication Sig Dispense Refill Last Dose  . Prenatal Vit-Fe Fumarate-FA (PRENATAL COMPLETE) 14-0.4 MG TABS Take 1 tablet by mouth daily. 60 each 0 09/08/2014 at Unknown time  . benzocaine-resorcinol (VAGISIL) 5-2 % vaginal cream Place vaginally as needed for itching.   more than one month  . ibuprofen (ADVIL,MOTRIN) 800 MG tablet Take 1 tablet (800 mg total) by mouth 3 (three) times daily. (Patient not taking: Reported on 09/09/2014) 21 tablet 0   . metroNIDAZOLE (FLAGYL) 500 MG tablet Take 1 tablet (500 mg total) by mouth 2 (two) times daily. (Patient not taking: Reported on 09/09/2014) 14 tablet 0   . ondansetron (ZOFRAN) 4 MG tablet Take 1 tablet (4 mg total) by mouth every 6 (six) hours. (Patient not taking: Reported on 09/09/2014) 12 tablet 0    Filed Vitals:   09/09/14 2038  BP: 124/74  Pulse: 91  Temp: 97.6 F (36.4 C)  Resp: 20  SpO2: 99%   ROS See HPI above, all other systems are negative  Physical Exam   Blood pressure  124/74, pulse 91, temperature 97.6 F (36.4 C), resp. rate 20, last menstrual period 06/13/2014, SpO2 99 %.  Physical Exam Ext:  WNL ABD: Soft, non tender to palpation, no rebound or guarding SVE: deferred    ED Course  Assessment: IUP at  18.6 weeks Membranes: intact FHR: 141 CTX:  none   Plan: Tylenol 650mg  PIH Labs   Gale Hulse, CNM, MSN 09/09/2014. 9:26 PM

## 2014-09-15 LAB — OB RESULTS CONSOLE GC/CHLAMYDIA
Chlamydia: NEGATIVE
Gonorrhea: NEGATIVE

## 2014-10-13 ENCOUNTER — Emergency Department (HOSPITAL_COMMUNITY)
Admission: EM | Admit: 2014-10-13 | Discharge: 2014-10-13 | Disposition: A | Discharge status: Home / Self Care | Payer: Medicaid Other | Attending: Emergency Medicine | Admitting: Emergency Medicine

## 2014-10-13 ENCOUNTER — Encounter (HOSPITAL_COMMUNITY): Payer: Self-pay | Admitting: *Deleted

## 2014-10-13 ENCOUNTER — Emergency Department (HOSPITAL_COMMUNITY): Payer: Medicaid Other

## 2014-10-13 DIAGNOSIS — Z79899 Other long term (current) drug therapy: Secondary | ICD-10-CM | POA: Diagnosis not present

## 2014-10-13 DIAGNOSIS — Z720 Tobacco use: Secondary | ICD-10-CM | POA: Diagnosis not present

## 2014-10-13 DIAGNOSIS — R7611 Nonspecific reaction to tuberculin skin test without active tuberculosis: Secondary | ICD-10-CM | POA: Diagnosis not present

## 2014-10-13 DIAGNOSIS — R05 Cough: Secondary | ICD-10-CM | POA: Diagnosis present

## 2014-10-13 DIAGNOSIS — J069 Acute upper respiratory infection, unspecified: Secondary | ICD-10-CM | POA: Diagnosis not present

## 2014-10-13 DIAGNOSIS — R059 Cough, unspecified: Secondary | ICD-10-CM

## 2014-10-13 LAB — COMPREHENSIVE METABOLIC PANEL
ALBUMIN: 3.2 g/dL — AB (ref 3.5–5.2)
ALT: 13 U/L (ref 0–35)
AST: 14 U/L (ref 0–37)
Alkaline Phosphatase: 51 U/L (ref 39–117)
Anion gap: 7 (ref 5–15)
BILIRUBIN TOTAL: 0.4 mg/dL (ref 0.3–1.2)
BUN: 7 mg/dL (ref 6–23)
CO2: 22 mmol/L (ref 19–32)
CREATININE: 0.38 mg/dL — AB (ref 0.50–1.10)
Calcium: 8.7 mg/dL (ref 8.4–10.5)
Chloride: 106 mmol/L (ref 96–112)
GFR calc non Af Amer: >90 mL/min (ref >90)
GLUCOSE: 98 mg/dL (ref 70–99)
Potassium: 3.9 mmol/L (ref 3.5–5.1)
Sodium: 135 mmol/L (ref 135–145)
Total Protein: 6.7 g/dL (ref 6.0–8.3)

## 2014-10-13 LAB — URINALYSIS, ROUTINE W REFLEX MICROSCOPIC
Bilirubin Urine: NEGATIVE
Glucose, UA: NEGATIVE mg/dL
Hgb urine dipstick: NEGATIVE
Ketones, ur: NEGATIVE mg/dL
Leukocytes, UA: NEGATIVE
Nitrite: NEGATIVE
PROTEIN: NEGATIVE mg/dL
SPECIFIC GRAVITY, URINE: 1.019 (ref 1.005–1.030)
UROBILINOGEN UA: 0.2 mg/dL (ref 0.0–1.0)
pH: 7.5 (ref 5.0–8.0)

## 2014-10-13 LAB — CBC WITH DIFFERENTIAL/PLATELET
BASOS ABS: 0 10*3/uL (ref 0.0–0.1)
Basophils Relative: 0 % (ref 0–1)
Eosinophils Absolute: 0.1 10*3/uL (ref 0.0–0.7)
Eosinophils Relative: 2 % (ref 0–5)
HEMATOCRIT: 32.1 % — AB (ref 36.0–46.0)
HEMOGLOBIN: 10.6 g/dL — AB (ref 12.0–15.0)
Lymphocytes Relative: 30 % (ref 12–46)
Lymphs Abs: 2.7 10*3/uL (ref 0.7–4.0)
MCH: 28.3 pg (ref 26.0–34.0)
MCHC: 33 g/dL (ref 30.0–36.0)
MCV: 85.6 fL (ref 78.0–100.0)
MONO ABS: 0.6 10*3/uL (ref 0.1–1.0)
MONOS PCT: 7 % (ref 3–12)
NEUTROS ABS: 5.4 10*3/uL (ref 1.7–7.7)
Neutrophils Relative %: 61 % (ref 43–77)
Platelets: 241 10*3/uL (ref 150–400)
RBC: 3.75 MIL/uL — ABNORMAL LOW (ref 3.87–5.11)
RDW: 13 % (ref 11.5–15.5)
WBC: 8.9 10*3/uL (ref 4.0–10.5)

## 2014-10-13 LAB — LIPASE, BLOOD: LIPASE: 25 U/L (ref 11–59)

## 2014-10-13 MED ORDER — ACETAMINOPHEN 500 MG PO TABS
1000.0000 mg | ORAL_TABLET | Freq: Once | ORAL | Status: AC
  Administered 2014-10-13: 1000 mg via ORAL
  Filled 2014-10-13: qty 2

## 2014-10-13 MED ORDER — ONDANSETRON 4 MG PO TBDP
4.0000 mg | ORAL_TABLET | Freq: Once | ORAL | Status: AC
  Administered 2014-10-13: 4 mg via ORAL
  Filled 2014-10-13: qty 1

## 2014-10-13 NOTE — Progress Notes (Signed)
Pt is 4470w5d gestation sent to ER by OB office for evaluation of +PPD test. Pt states she is unaware of how she was exposed. No cough, no sweating. Baby active FH 140 with good variability, no decels. No c/o abd cramping or contractions, no ROM or vag bleeding. [facility] Osborn CohoAngela Roberts notified of pt status. Pt cleared by OB, to keep regular scheduled appointment. Reviewed signs of preterm labor and ROM.

## 2014-10-13 NOTE — ED Notes (Signed)
Patient was made aware that an urine sample is needed. Patient is unable to void at this time. Patient is encouraged to void when able.

## 2014-10-13 NOTE — ED Notes (Addendum)
Pt states [facility] told her to come to ED due to positive PPD. Pt complains of cough, sweating, abdominal pain for the past 3 days. Pt denies blood in sputum. Pt is 5 months pregnant

## 2014-10-13 NOTE — ED Provider Notes (Signed)
TIME SEEN: 1:20 PM  CHIEF COMPLAINT: Cough, abdominal pain, nausea, sweating  HPI: Pt is a 24 y.o. female who is [redacted] weeks pregnant with a due date of June 22 2 presents to the emergency department with several days of sweats, dry cough, body aches, nausea, diffuse abdominal pain. 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  States that the area was erythematous and painful. She states it was enlarged but is unsure if it was actually raised. She states that her doctor told her was greater than 15 mm. She was instructed to follow up and states she did not. States at that time she was feeling well. Over the past several days where she has had sweats, dry cough, body aches, nausea and abdominal pain. She is feeling her baby move. No vaginal bleeding or discharge. No contraction-like pain. No leaking fluid. Denies vomiting or diarrhea. Denies a prior history of tuberculosis, hemoptysis, recent travel. States she was in jail for 2 months approximately 2 years ago. No history of IV drug abuse and no history of HIV. She has never had sex with a man who has had sex with another man.  ROS: See HPI Constitutional: no fever  Eyes: no drainage  ENT: no runny nose   Cardiovascular:  no chest pain  Resp: no SOB  GI: no vomiting GU: no dysuria Integumentary: no rash  Allergy: no hives  Musculoskeletal: no leg swelling  Neurological: no slurred speech ROS otherwise negative  PAST MEDICAL HISTORY/PAST SURGICAL HISTORY:  History reviewed. No pertinent past medical history.  MEDICATIONS:  Prior to Admission medications   Medication Sig Start Date End Date Taking? Authorizing Provider  benzocaine-resorcinol (VAGISIL) 5-2 % vaginal cream Place vaginally as needed for itching.    Historical Provider, MD  ferrous sulfate 325 (65 FE) MG EC tablet Take 1 tablet (325 mg total) by mouth 2 (two) times daily. 09/09/14  09/09/15  Venus Standard, CNM  ibuprofen (ADVIL,MOTRIN) 800 MG tablet Take 1 tablet (800 mg total) by mouth 3 (three) times daily. Patient not taking: Reported on 09/09/2014 04/04/14   Fayrene Helper, PA-C  metroNIDAZOLE (FLAGYL) 500 MG tablet Take 1 tablet (500 mg total) by mouth 2 (two) times daily. Patient not taking: Reported on 09/09/2014 06/18/14   Garlon Hatchet, PA-C  ondansetron (ZOFRAN) 4 MG tablet Take 1 tablet (4 mg total) by mouth every 6 (six) hours. Patient not taking: Reported on 09/09/2014 06/18/14   Garlon Hatchet, PA-C  Prenatal Vit-Fe Fumarate-FA (PRENATAL COMPLETE) 14-0.4 MG TABS Take 1 tablet by mouth daily. 06/18/14   Garlon Hatchet, PA-C    ALLERGIES:  Allergies  Allergen Reactions  . Latex     rash    SOCIAL HISTORY:  History  Substance Use Topics  . Smoking status: Current Every Day Smoker    Types: Cigars  . Smokeless tobacco: Not on file  . Alcohol Use: Yes    FAMILY HISTORY: No family history on file.  EXAM: BP 114/49 mmHg  Pulse 85  Temp(Src) 98.7 F (37.1 C) (Oral)  Resp 18  SpO2 99%  LMP 06/13/2014 CONSTITUTIONAL: Alert and oriented and responds appropriately to questions. Well-appearing; well-nourished, nontoxic HEAD: Normocephalic EYES: Conjunctivae clear, PERRL ENT: normal nose; no rhinorrhea; moist mucous membranes; pharynx without lesions noted NECK: Supple, no meningismus, no LAD  CARD: RRR; S1 and S2 appreciated; no murmurs, no clicks, no rubs,  no gallops RESP: Normal chest excursion without splinting or tachypnea; breath sounds clear and equal bilaterally; no wheezes, no rhonchi, no rales, no hypoxia or respiratory distress ABD/GI: Normal bowel sounds; non-distended; soft, diffusely mildly tender to palpation without guarding or rebound, obese BACK:  The back appears normal and is non-tender to palpation, there is no CVA tenderness EXT: Normal ROM in all joints; non-tender to palpation; no edema; normal capillary refill; no cyanosis    SKIN:  Normal color for age and race; warm NEURO: Moves all extremities equally PSYCH: The patient's mood and manner are appropriate. Grooming and personal hygiene are appropriate.  MEDICAL DECISION MAKING: Patient here with reports of a positive PPD test approximately a week ago. Her PPD site is now completely normal and I unable to tell if this was truly positive are not. We'll proceed with chest x-ray. She is also complaining of abdominal pain and has diffusely tender abdomen. We'll have OB rapid response nurse to evaluate patient. We'll check abdominal labs, urinalysis. I do not feel her abdominal exam is so significant that she needs any abdominal imaging at this time. Will give Zofran for her nausea.  ED PROGRESS: Patient's labs are unremarkable. Chest x-ray shows no acute abdomen. Discussed with Dr. Ninetta LightsHatcher with infectious disease. He recommends the patient follow-up with the health department and they can decide if she needs to be started on antituberculosis medications. He states he normally like to wait until after delivery. Urinalysis is pending. I feel patient can be taken off of precautions and will likely be discharged home.   OB nurse has cleared pt.  Please see note. She had fetal heart tones in the 140s. Patient reports her nausea, abdominal pain gone.  Pt tolerating po.  UA negative.  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  Layla MawKristen N Gyan Cambre, DO 10/13/14 1644

## 2014-10-13 NOTE — ED Notes (Signed)
Bed: [id number] Expected date:  Expected time:  Means of arrival:  Comments: HOLD for triage

## 2014-10-13 NOTE — ED Notes (Signed)
OB Rapid Response nurse at bedside.

## 2014-10-13 NOTE — Discharge Instructions (Signed)
Giving you had a reported history of a positive PPD test, infectious disease recommended you follow-up with a [facility] health Department who will help decide if you should be started on medications after delivery for tuberculosis. Your chest x-ray, labs today were normal.  Your symptoms are not caused by tuberculosis. You likely have a virus causing your abdominal pain, nausea, cough. You may take Tylenol 1000 mg as needed for fever and pain.    Upper Respiratory Infection, Adult An upper respiratory infection (URI) is also sometimes known as the common cold. The upper respiratory tract includes the nose, sinuses, throat, trachea, and bronchi. Bronchi are the airways leading to the lungs. Most people improve within 1 week, but symptoms can last up to 2 weeks. A residual cough may last even longer.  CAUSES Many different viruses can infect the tissues lining the upper respiratory tract. The tissues become irritated and inflamed and often become very moist. Mucus production is also common. A cold is contagious. You can easily spread the virus to others by oral contact. This includes kissing, sharing a glass, coughing, or sneezing. Touching your mouth or nose and then touching a surface, which is then touched by another person, can also spread the virus. SYMPTOMS  Symptoms typically develop 1 to 3 days after you come in contact with a cold virus. Symptoms vary from person to person. They may include:  Runny nose.  Sneezing.  Nasal congestion.  Sinus irritation.  Sore throat.  Loss of voice (laryngitis).  Cough.  Fatigue.  Muscle aches.  Loss of appetite.  Headache.  Low-grade fever. DIAGNOSIS  You might diagnose your own cold based on familiar symptoms, since most people get a cold 2 to 3 times a year. Your caregiver can confirm this based on your exam. Most importantly, your caregiver can check that your symptoms are not due to another disease such as strep throat, sinusitis,  pneumonia, asthma, or epiglottitis. Blood tests, throat tests, and X-rays are not necessary to diagnose a common cold, but they may sometimes be helpful in excluding other more serious diseases. Your caregiver will decide if any further tests are required. RISKS AND COMPLICATIONS  You may be at risk for a more severe case of the common cold if you smoke cigarettes, have chronic heart disease (such as heart failure) or lung disease (such as asthma), or if you have a weakened immune system. The very young and very old are also at risk for more serious infections. Bacterial sinusitis, middle ear infections, and bacterial pneumonia can complicate the common cold. The common cold can worsen asthma and chronic obstructive pulmonary disease (COPD). Sometimes, these complications can require emergency medical care and may be life-threatening. PREVENTION  The best way to protect against getting a cold is to practice good hygiene. Avoid oral or hand contact with people with cold symptoms. Wash your hands often if contact occurs. There is no clear evidence that vitamin C, vitamin E, echinacea, or exercise reduces the chance of developing a cold. However, it is always recommended to get plenty of rest and practice good nutrition. TREATMENT  Treatment is directed at relieving symptoms. There is no cure. Antibiotics are not effective, because the infection is caused by a virus, not by bacteria. Treatment may include:  Increased fluid intake. Sports drinks offer valuable electrolytes, sugars, and fluids.  Breathing heated mist or steam (vaporizer or shower).  Eating chicken soup or other clear broths, and maintaining good nutrition.  Getting plenty of rest.  Using  gargles or lozenges for comfort.  Controlling fevers with ibuprofen or acetaminophen as directed by your caregiver.  Increasing usage of your inhaler if you have asthma. Zinc gel and zinc lozenges, taken in the first 24 hours of the common cold, can  shorten the duration and lessen the severity of symptoms. Pain medicines may help with fever, muscle aches, and throat pain. A variety of non-prescription medicines are available to treat congestion and runny nose. Your caregiver can make recommendations and may suggest nasal or lung inhalers for other symptoms.  HOME CARE INSTRUCTIONS   Only take over-the-counter or prescription medicines for pain, discomfort, or fever as directed by your caregiver.  Use a warm mist humidifier or inhale steam from a shower to increase air moisture. This may keep secretions moist and make it easier to breathe.  Drink enough water and fluids to keep your urine clear or pale yellow.  Rest as needed.  Return to work when your temperature has returned to normal or as your caregiver advises. You may need to stay home longer to avoid infecting others. You can also use a face mask and careful hand washing to prevent spread of the virus. SEEK MEDICAL CARE IF:   After the first few days, you feel you are getting worse rather than better.  You need your caregiver's advice about medicines to control symptoms.  You develop chills, worsening shortness of breath, or brown or red sputum. These may be signs of pneumonia.  You develop yellow or brown nasal discharge or pain in the face, especially when you bend forward. These may be signs of sinusitis.  You develop a fever, swollen neck glands, pain with swallowing, or white areas in the back of your throat. These may be signs of strep throat. SEEK IMMEDIATE MEDICAL CARE IF:   You have a fever.  You develop severe or persistent headache, ear pain, sinus pain, or chest pain.  You develop wheezing, a prolonged cough, cough up blood, or have a change in your usual mucus (if you have chronic lung disease).  You develop sore muscles or a stiff neck. Document Released: 01/24/2001 Document Revised: 10/23/2011 Document Reviewed: 11/05/2013 [facility] Patient Information 2015  [place]. This information is not intended to replace advice given to you by your health care provider. Make sure you discuss any questions you have with your health care provider.

## 2014-12-28 ENCOUNTER — Encounter (HOSPITAL_COMMUNITY): Payer: Self-pay | Admitting: *Deleted

## 2014-12-28 ENCOUNTER — Inpatient Hospital Stay (HOSPITAL_COMMUNITY)
Admission: AD | Admit: 2014-12-28 | Discharge: 2014-12-28 | Disposition: A | Discharge status: Home / Self Care | Payer: Medicaid Other | Source: Ambulatory Visit | Attending: Obstetrics and Gynecology | Admitting: Obstetrics and Gynecology

## 2014-12-28 DIAGNOSIS — Z6841 Body Mass Index (BMI) 40.0 and over, adult: Secondary | ICD-10-CM | POA: Diagnosis not present

## 2014-12-28 DIAGNOSIS — Z87891 Personal history of nicotine dependence: Secondary | ICD-10-CM | POA: Insufficient documentation

## 2014-12-28 DIAGNOSIS — Z3A34 34 weeks gestation of pregnancy: Secondary | ICD-10-CM | POA: Diagnosis not present

## 2014-12-28 DIAGNOSIS — E669 Obesity, unspecified: Secondary | ICD-10-CM | POA: Insufficient documentation

## 2014-12-28 DIAGNOSIS — K219 Gastro-esophageal reflux disease without esophagitis: Secondary | ICD-10-CM | POA: Insufficient documentation

## 2014-12-28 DIAGNOSIS — O99613 Diseases of the digestive system complicating pregnancy, third trimester: Secondary | ICD-10-CM | POA: Insufficient documentation

## 2014-12-28 DIAGNOSIS — O99213 Obesity complicating pregnancy, third trimester: Secondary | ICD-10-CM | POA: Diagnosis not present

## 2014-12-28 DIAGNOSIS — IMO0001 Reserved for inherently not codable concepts without codable children: Secondary | ICD-10-CM | POA: Diagnosis present

## 2014-12-28 DIAGNOSIS — O9989 Other specified diseases and conditions complicating pregnancy, childbirth and the puerperium: Secondary | ICD-10-CM | POA: Diagnosis present

## 2014-12-28 HISTORY — DX: Anemia, unspecified: D64.9

## 2014-12-28 HISTORY — DX: Headache: R51

## 2014-12-28 HISTORY — DX: Obesity, unspecified: E66.9

## 2014-12-28 HISTORY — DX: Major depressive disorder, single episode, unspecified: F32.9

## 2014-12-28 HISTORY — DX: Depression, unspecified: F32.A

## 2014-12-28 HISTORY — DX: Headache, unspecified: R51.9

## 2014-12-28 HISTORY — DX: Chlamydial infection, unspecified: A74.9

## 2014-12-28 LAB — COMPREHENSIVE METABOLIC PANEL
ALT: 27 U/L (ref 14–54)
AST: 22 U/L (ref 15–41)
Albumin: 3 g/dL — ABNORMAL LOW (ref 3.5–5.0)
Alkaline Phosphatase: 122 U/L (ref 38–126)
Anion gap: 8 (ref 5–15)
BILIRUBIN TOTAL: 0.3 mg/dL (ref 0.3–1.2)
BUN: 6 mg/dL (ref 6–20)
CHLORIDE: 104 mmol/L (ref 101–111)
CO2: 22 mmol/L (ref 22–32)
Calcium: 8.5 mg/dL — ABNORMAL LOW (ref 8.9–10.3)
Creatinine, Ser: 0.42 mg/dL — ABNORMAL LOW (ref 0.44–1.00)
GFR calc non Af Amer: >60 mL/min (ref >60)
GLUCOSE: 82 mg/dL (ref 65–99)
Potassium: 3.9 mmol/L (ref 3.5–5.1)
Sodium: 134 mmol/L — ABNORMAL LOW (ref 135–145)
Total Protein: 7.4 g/dL (ref 6.5–8.1)

## 2014-12-28 LAB — CBC
HEMATOCRIT: 33.6 % — AB (ref 36.0–46.0)
Hemoglobin: 11.2 g/dL — ABNORMAL LOW (ref 12.0–15.0)
MCH: 27.6 pg (ref 26.0–34.0)
MCHC: 33.3 g/dL (ref 30.0–36.0)
MCV: 82.8 fL (ref 78.0–100.0)
Platelets: 256 10*3/uL (ref 150–400)
RBC: 4.06 MIL/uL (ref 3.87–5.11)
RDW: 13.8 % (ref 11.5–15.5)
WBC: 9.4 10*3/uL (ref 4.0–10.5)

## 2014-12-28 LAB — URINALYSIS, ROUTINE W REFLEX MICROSCOPIC
BILIRUBIN URINE: NEGATIVE
GLUCOSE, UA: NEGATIVE mg/dL
Hgb urine dipstick: NEGATIVE
KETONES UR: NEGATIVE mg/dL
Leukocytes, UA: NEGATIVE
Nitrite: NEGATIVE
PROTEIN: NEGATIVE mg/dL
Specific Gravity, Urine: >1.03 — ABNORMAL HIGH (ref 1.005–1.030)
Urobilinogen, UA: 0.2 mg/dL (ref 0.0–1.0)
pH: 6 (ref 5.0–8.0)

## 2014-12-28 LAB — PROTEIN / CREATININE RATIO, URINE
CREATININE, URINE: 247 mg/dL
Protein Creatinine Ratio: 0.1 mg/mg{Cre} (ref 0.00–0.15)
Total Protein, Urine: 24 mg/dL

## 2014-12-28 LAB — URIC ACID: URIC ACID, SERUM: 3.6 mg/dL (ref 2.3–6.6)

## 2014-12-28 LAB — LACTATE DEHYDROGENASE: LDH: 174 U/L (ref 98–192)

## 2014-12-28 MED ORDER — PROMETHAZINE HCL 25 MG/ML IJ SOLN
25.0000 mg | INTRAMUSCULAR | Status: AC
  Administered 2014-12-28: 25 mg via INTRAVENOUS
  Filled 2014-12-28: qty 1

## 2014-12-28 MED ORDER — LACTATED RINGERS IV BOLUS (SEPSIS)
1000.0000 mL | Freq: Once | INTRAVENOUS | Status: AC
  Administered 2014-12-28: 1000 mL via INTRAVENOUS

## 2014-12-28 MED ORDER — ONDANSETRON HCL 4 MG PO TABS
4.0000 mg | ORAL_TABLET | Freq: Once | ORAL | Status: AC
  Administered 2014-12-28: 4 mg via ORAL
  Filled 2014-12-28: qty 1

## 2014-12-28 MED ORDER — PANTOPRAZOLE SODIUM 40 MG IV SOLR
40.0000 mg | INTRAVENOUS | Status: DC
  Administered 2014-12-28: 40 mg via INTRAVENOUS
  Filled 2014-12-28 (×2): qty 40

## 2014-12-28 MED ORDER — RANITIDINE HCL 150 MG PO TABS
150.0000 mg | ORAL_TABLET | Freq: Two times a day (BID) | ORAL | Status: DC
Start: 2014-12-28 — End: 2015-01-11

## 2014-12-28 MED ORDER — PROMETHAZINE HCL 25 MG PO TABS: 25.0000 mg | ORAL_TABLET | Freq: Four times a day (QID) | ORAL | Status: DC | PRN

## 2014-12-28 NOTE — MAU Note (Signed)
Pt c/o "funny" taste in mouth when she burps, says it tastes like eggs. When she has this taste she gets "queezy/nauseous" with some light headedness and dizziness. She says that when she sleeps her hands get numb. She thinks she might be dehydrated and says she has not drank anything since yesterday. She says she IS able to keep fluids and solids down.

## 2014-12-28 NOTE — MAU Provider Note (Signed)
History   24 yo G3P1011 at 7034 4/7 weeks presented unannounced c/o "burping egg smell" and nausea, "just not feeling good", feels "dry".  Denies active vomiting, abdominal pain, dysuria, fever, HA, visual sx, or epigastric pain, or diarrhea.  No BM today, but has not struggled with constipation.  Reports +FM.  [facility] but presented to [facility] prior to visit.  Patient Active Problem List   Diagnosis Date Noted  . Reflux 12/28/2014  . Obesity--BMI 49 12/28/2014    Chief Complaint  Patient presents with  . Nausea   HPI:  See above  OB History    Gravida Para Term Preterm AB TAB SAB Ectopic Multiple Living   3 1 1  1 1    1       Past Medical History  Diagnosis Date  . Anemia   . Depression   . Headache   . Obesity   . Chlamydia     Past Surgical History  Procedure Laterality Date  . Open reduction and internal fixation of ankle fracture.    . Fracture surgery      L tibia     No family history on file.  History  Substance Use Topics  . Smoking status: Former Smoker    Types: Cigars    Quit date: 07/14/2014  . Smokeless tobacco: Never Used  . Alcohol Use: No     Comment: not while pregnant     Allergies:  Allergies  Allergen Reactions  . Latex     rash    No prescriptions prior to admission    ROS:  Nausea, no vomiting, frequent burping, +FM Physical Exam   Blood pressure 133/70, pulse 106, resp. rate 18, height 5\' 8"  (1.727 m), weight 144.471 kg (318 lb 8 oz), last menstrual period 06/13/2014, SpO2 100 %.   Filed Vitals:   12/28/14 1617 12/28/14 1632 12/28/14 1640 12/28/14 1642  BP: 139/82 149/88 133/70 133/70  Pulse: 110 104 106 106  TempSrc:      Resp:    18  Height:      Weight:      SpO2:        BP cuff changed to appropriate-sized cuff around 1220 1st BP after that 122/77.   Results for orders placed or performed during the hospital encounter of 12/28/14 (from the past 24 hour(s))  Urinalysis, Routine w  reflex microscopic     Status: Abnormal   Collection Time: 12/28/14  9:50 AM  Result Value Ref Range   Color, Urine YELLOW YELLOW   APPearance CLEAR CLEAR   Specific Gravity, Urine >1.030 (H) 1.005 - 1.030   pH 6.0 5.0 - 8.0   Glucose, UA NEGATIVE NEGATIVE mg/dL   Hgb urine dipstick NEGATIVE NEGATIVE   Bilirubin Urine NEGATIVE NEGATIVE   Ketones, ur NEGATIVE NEGATIVE mg/dL   Protein, ur NEGATIVE NEGATIVE mg/dL   Urobilinogen, UA 0.2 0.0 - 1.0 mg/dL   Nitrite NEGATIVE NEGATIVE   Leukocytes, UA NEGATIVE NEGATIVE  Protein / creatinine ratio, urine     Status: None   Collection Time: 12/28/14  9:50 AM  Result Value Ref Range   Creatinine, Urine 247.00 mg/dL   Total Protein, Urine 24 mg/dL   Protein Creatinine Ratio 0.10 0.00 - 0.15 mg/mg[Cre]  CBC     Status: Abnormal   Collection Time: 12/28/14 12:34 PM  Result Value Ref Range   WBC 9.4 4.0 - 10.5 K/uL   RBC 4.06 3.87 - 5.11 MIL/uL  Hemoglobin 11.2 (L) 12.0 - 15.0 g/dL   HCT 16.133.6 (L) 09.636.0 - 04.546.0 %   MCV 82.8 78.0 - 100.0 fL   MCH 27.6 26.0 - 34.0 pg   MCHC 33.3 30.0 - 36.0 g/dL   RDW 40.913.8 81.111.5 - 91.415.5 %   Platelets 256 150 - 400 K/uL  Comprehensive metabolic panel     Status: Abnormal   Collection Time: 12/28/14 12:34 PM  Result Value Ref Range   Sodium 134 (L) 135 - 145 mmol/L   Potassium 3.9 3.5 - 5.1 mmol/L   Chloride 104 101 - 111 mmol/L   CO2 22 22 - 32 mmol/L   Glucose, Bld 82 65 - 99 mg/dL   BUN 6 6 - 20 mg/dL   Creatinine, Ser 7.820.42 (L) 0.44 - 1.00 mg/dL   Calcium 8.5 (L) 8.9 - 10.3 mg/dL   Total Protein 7.4 6.5 - 8.1 g/dL   Albumin 3.0 (L) 3.5 - 5.0 g/dL   AST 22 15 - 41 U/L   ALT 27 14 - 54 U/L   Alkaline Phosphatase 122 38 - 126 U/L   Total Bilirubin 0.3 0.3 - 1.2 mg/dL   GFR calc non Af Amer >60 >60 mL/min   GFR calc Af Amer >60 >60 mL/min   Anion gap 8 5 - 15  Lactate dehydrogenase     Status: None   Collection Time: 12/28/14 12:34 PM  Result Value Ref Range   LDH 174 98 - 192 U/L  Uric acid      Status: None   Collection Time: 12/28/14 12:34 PM  Result Value Ref Range   Uric Acid, Serum 3.6 2.3 - 6.6 mg/dL     Physical Exam  In NAD Chest clear Heart RRR without murmur Abd gravid, NT Pelvic--WNL Ext DTR 1+, no clonus, tr edema  FHR Category 1 No UCs  ED Course  Assessment: IUP at 34 4/7 weeks Odorous burps Nausea No evidence gestational hypertension  Plan: IV hydration. Protonix IV Zofran  Nigel BridgemanLATHAM, Kenyen Candy CNM, MSN 12/28/2014 1300  Addendum: Received 2 bags IVF, had BM, received Zofran, Phenergan, and Protonix in MAU.  Had normal BM in MAU. Feeling better now.  D/C'd home with Rx for Phenergan and Zantac Recommended bland diet for 24-48 hours. Office will call to re-schedule ROB in 1 week.  Nigel BridgemanVicki Ithan Touhey, CNM 12/28/14 1600

## 2014-12-28 NOTE — MAU Note (Signed)
Pt feels nauseous again and is burping "boiled egg" like taste/smell.

## 2014-12-28 NOTE — Discharge Instructions (Signed)
Gastroesophageal Reflux Disease, Adult Gastroesophageal reflux disease (GERD) happens when acid from your stomach flows up into the esophagus. When acid comes in contact with the esophagus, the acid causes soreness (inflammation) in the esophagus. Over time, GERD may create small holes (ulcers) in the lining of the esophagus. CAUSES   Increased body weight. This puts pressure on the stomach, making acid rise from the stomach into the esophagus.  Smoking. This increases acid production in the stomach.  Drinking alcohol. This causes decreased pressure in the lower esophageal sphincter (valve or ring of muscle between the esophagus and stomach), allowing acid from the stomach into the esophagus.  Late evening meals and a full stomach. This increases pressure and acid production in the stomach.  A malformed lower esophageal sphincter. Sometimes, no cause is found. SYMPTOMS   Burning pain in the lower part of the mid-chest behind the breastbone and in the mid-stomach area. This may occur twice a week or more often.  Trouble swallowing.  Sore throat.  Dry cough.  Asthma-like symptoms including chest tightness, shortness of breath, or wheezing. DIAGNOSIS  Your caregiver may be able to diagnose GERD based on your symptoms. In some cases, X-rays and other tests may be done to check for complications or to check the condition of your stomach and esophagus. TREATMENT  Your caregiver may recommend over-the-counter or prescription medicines to help decrease acid production. Ask your caregiver before starting or adding any new medicines.  HOME CARE INSTRUCTIONS   Change the factors that you can control. Ask your caregiver for guidance concerning weight loss, quitting smoking, and alcohol consumption.  Avoid foods and drinks that make your symptoms worse, such as:  Caffeine or alcoholic drinks.  Chocolate.  Peppermint or mint flavorings.  Garlic and onions.  Spicy foods.  Citrus fruits,  such as oranges, lemons, or limes.  Tomato-based foods such as sauce, chili, salsa, and pizza.  Fried and fatty foods.  Avoid lying down for the 3 hours prior to your bedtime or prior to taking a nap.  Eat small, frequent meals instead of large meals.  Wear loose-fitting clothing. Do not wear anything tight around your waist that causes pressure on your stomach.  Raise the head of your bed 6 to 8 inches with wood blocks to help you sleep. Extra pillows will not help.  Only take over-the-counter or prescription medicines for pain, discomfort, or fever as directed by your caregiver.  Do not take aspirin, ibuprofen, or other nonsteroidal anti-inflammatory drugs (NSAIDs). SEEK IMMEDIATE MEDICAL CARE IF:   You have pain in your arms, neck, jaw, teeth, or back.  Your pain increases or changes in intensity or duration.  You develop nausea, vomiting, or sweating (diaphoresis).  You develop shortness of breath, or you faint.  Your vomit is green, yellow, black, or looks like coffee grounds or blood.  Your stool is red, bloody, or black. These symptoms could be signs of other problems, such as heart disease, gastric bleeding, or esophageal bleeding. MAKE SURE YOU:   Understand these instructions.  Will watch your condition.  Will get help right away if you are not doing well or get worse. Document Released: 05/10/2005 Document Revised: 10/23/2011 Document Reviewed: 02/17/2011 [employer] Patient Information 2015 [employer]. This information is not intended to replace advice given to you by your health care provider. Make sure you discuss any questions you have with your health care provider.  

## 2014-12-28 NOTE — MAU Note (Signed)
Pt reeprots she started feeling "queezy and hot. Stated when she burps it tastes like eggs. Also reports she has not felt baby move.

## 2015-01-11 ENCOUNTER — Inpatient Hospital Stay (HOSPITAL_COMMUNITY)
Admission: AD | Admit: 2015-01-11 | Discharge: 2015-01-11 | Disposition: A | Discharge status: Home / Self Care | Payer: Medicaid Other | Source: Ambulatory Visit | Attending: Obstetrics & Gynecology | Admitting: Obstetrics & Gynecology

## 2015-01-11 ENCOUNTER — Encounter (HOSPITAL_COMMUNITY): Payer: Self-pay | Admitting: *Deleted

## 2015-01-11 DIAGNOSIS — O99613 Diseases of the digestive system complicating pregnancy, third trimester: Secondary | ICD-10-CM | POA: Insufficient documentation

## 2015-01-11 DIAGNOSIS — Z3A36 36 weeks gestation of pregnancy: Secondary | ICD-10-CM | POA: Insufficient documentation

## 2015-01-11 DIAGNOSIS — IMO0001 Reserved for inherently not codable concepts without codable children: Secondary | ICD-10-CM

## 2015-01-11 DIAGNOSIS — R110 Nausea: Secondary | ICD-10-CM | POA: Insufficient documentation

## 2015-01-11 DIAGNOSIS — K219 Gastro-esophageal reflux disease without esophagitis: Secondary | ICD-10-CM | POA: Insufficient documentation

## 2015-01-11 DIAGNOSIS — O9989 Other specified diseases and conditions complicating pregnancy, childbirth and the puerperium: Secondary | ICD-10-CM | POA: Diagnosis present

## 2015-01-11 DIAGNOSIS — Z91040 Latex allergy status: Secondary | ICD-10-CM | POA: Insufficient documentation

## 2015-01-11 DIAGNOSIS — Z87891 Personal history of nicotine dependence: Secondary | ICD-10-CM | POA: Insufficient documentation

## 2015-01-11 LAB — URINALYSIS, ROUTINE W REFLEX MICROSCOPIC
BILIRUBIN URINE: NEGATIVE
GLUCOSE, UA: NEGATIVE mg/dL
Hgb urine dipstick: NEGATIVE
Ketones, ur: NEGATIVE mg/dL
NITRITE: NEGATIVE
Protein, ur: NEGATIVE mg/dL
Specific Gravity, Urine: 1.01 (ref 1.005–1.030)
Urobilinogen, UA: 0.2 mg/dL (ref 0.0–1.0)
pH: 6 (ref 5.0–8.0)

## 2015-01-11 LAB — URINE MICROSCOPIC-ADD ON

## 2015-01-11 MED ORDER — PANTOPRAZOLE SODIUM 20 MG PO TBEC: 20.0000 mg | DELAYED_RELEASE_TABLET | Freq: Every day | ORAL | Status: DC

## 2015-01-11 MED ORDER — PANTOPRAZOLE SODIUM 40 MG PO TBEC
40.0000 mg | DELAYED_RELEASE_TABLET | Freq: Once | ORAL | Status: AC
  Administered 2015-01-11: 40 mg via ORAL
  Filled 2015-01-11: qty 1

## 2015-01-11 MED ORDER — LACTATED RINGERS IV BOLUS (SEPSIS): 1000.0000 mL | Freq: Once | INTRAVENOUS | Status: AC

## 2015-01-11 NOTE — MAU Provider Note (Deleted)
Alicia Morgan is a 24 y.o. G3P1 at 36.4 weeks presented unannounced c/o "burping egg smell" and nausea, "just not feeling good", feels "dry". Denies active vomiting, abdominal pain, dysuria, fever, HA, visual sx, or epigastric pain, or diarrhea. No BM since Friday. Reports +FM.   History     Patient Active Problem List   Diagnosis Date Noted  . Reflux 12/28/2014  . Obesity--BMI 49 12/28/2014    Chief Complaint  Patient presents with  . Nausea   HPI  OB History    Gravida Para Term Preterm AB TAB SAB Ectopic Multiple Living   3 1 1  1 1    1       Past Medical History  Diagnosis Date  . Anemia   . Depression   . Headache   . Obesity   . Chlamydia     Past Surgical History  Procedure Laterality Date  . Open reduction and internal fixation of ankle fracture.    . Fracture surgery      L tibia     No family history on file.  History  Substance Use Topics  . Smoking status: Former Smoker    Types: Cigars    Quit date: 07/14/2014  . Smokeless tobacco: Never Used  . Alcohol Use: No     Comment: not while pregnant     Allergies:  Allergies  Allergen Reactions  . Latex     rash    Prescriptions prior to admission  Medication Sig Dispense Refill Last Dose  . cyclobenzaprine (FLEXERIL) 10 MG tablet Take 10 mg by mouth 3 (three) times daily as needed for muscle spasms.   01/10/2015 at Unknown time  . ondansetron (ZOFRAN) 4 MG tablet Take 1 tablet (4 mg total) by mouth every 6 (six) hours. 12 tablet 0 01/11/2015 at Unknown time  . Prenatal Vit-Fe Fumarate-FA (PRENATAL COMPLETE) 14-0.4 MG TABS Take 1 tablet by mouth daily. 60 each 0 01/10/2015 at Unknown time  . promethazine (PHENERGAN) 25 MG tablet Take 1 tablet (25 mg total) by mouth every 6 (six) hours as needed for nausea or vomiting. 36 tablet 1 01/11/2015 at Unknown time  . ranitidine (ZANTAC) 150 MG tablet Take 1 tablet (150 mg total) by mouth 2 (two) times daily. 60 tablet 1 01/11/2015 at Unknown time   . sertraline (ZOLOFT) 25 MG tablet Take 25 mg by mouth daily.   01/10/2015 at Unknown time    ROS See HPI above, all other systems are negative  Physical Exam   Blood pressure 133/70, pulse 106, resp. rate 18, height 5\' 8"  (1.727 m), weight 318 lb 8 oz (144.471 kg), last menstrual period 06/13/2014, SpO2 100 %.  Physical Exam Ext:  WNL ABD: Soft, non tender to palpation, no rebound or guarding SVE: deferred   ED Course  Assessment: IUP at  36.4weeks Membranes: intact FHR: Category 1 CTX:  None Nausea Prolong burping  Plan: Soap suds enema IV fluid protonix IV   Tyran Huser, CNM, MSN 01/11/2015. 6:55 PM

## 2015-01-11 NOTE — MAU Note (Signed)
Pt presents at 36 wks with c/o pain all across lower abd for the past four days ; worse last Saturday due to "a whole lot of walking and shopping".  Pt denies vag bleeding or leaking and reports good fetal movent.

## 2015-01-11 NOTE — MAU Provider Note (Signed)
Eknoor Novack, CNM Certified Nurse Midwife Cosign Needed  MAU Provider Note 01/11/2015 6:55 PM  Related encounter: [facility]Dis[facility]   Delete previous note   Expand All Collapse All   Alicia Morgan is a 24 y.o. G3P1 at 36.4 weeks presented unannounced c/o "burping egg smell" and nausea, "just not feeling good", feels "dry". Denies active vomiting, abdominal pain, dysuria, fever, HA, visual sx, or epigastric pain, or diarrhea. No BM since Friday. Reports +FM.   History     Patient Active Problem List   Diagnosis Date Noted  . Reflux 12/28/2014  . Obesity--BMI 49 12/28/2014    Chief Complaint  Patient presents with  . Nausea   HPI  OB History    Gravida Para Term Preterm AB TAB SAB Ectopic Multiple Living   3 1 1  1 1    1       Past Medical History  Diagnosis Date  . Anemia   . Depression   . Headache   . Obesity   . Chlamydia     Past Surgical History  Procedure Laterality Date  . Open reduction and internal fixation of ankle fracture.    . Fracture surgery      L tibia     No family history on file.  History  Substance Use Topics  . Smoking status: Former Smoker    Types: Cigars    Quit date: 07/14/2014  . Smokeless tobacco: Never Used  . Alcohol Use: No     Comment: not while pregnant     Allergies:  Allergies  Allergen Reactions  . Latex     rash    Prescriptions prior to admission  Medication Sig Dispense Refill Last Dose  . cyclobenzaprine (FLEXERIL) 10 MG tablet Take 10 mg by mouth 3 (three) times daily as needed for muscle spasms.   01/10/2015 at Unknown time  . ondansetron (ZOFRAN) 4 MG tablet Take 1 tablet (4 mg total) by mouth every 6 (six) hours. 12 tablet 0 01/11/2015 at Unknown time  . Prenatal Vit-Fe  Fumarate-FA (PRENATAL COMPLETE) 14-0.4 MG TABS Take 1 tablet by mouth daily. 60 each 0 01/10/2015 at Unknown time  . promethazine (PHENERGAN) 25 MG tablet Take 1 tablet (25 mg total) by mouth every 6 (six) hours as needed for nausea or vomiting. 36 tablet 1 01/11/2015 at Unknown time  . ranitidine (ZANTAC) 150 MG tablet Take 1 tablet (150 mg total) by mouth 2 (two) times daily. 60 tablet 1 01/11/2015 at Unknown time  . sertraline (ZOLOFT) 25 MG tablet Take 25 mg by mouth daily.   01/10/2015 at Unknown time    ROS See HPI above, all other systems are negative  Physical Exam   Blood pressure 133/70, pulse 106, resp. rate 18, height 5\' 8"  (1.727 m), weight 318 lb 8 oz (144.471 kg), last menstrual period 06/13/2014, SpO2 100 %.  Physical Exam Ext: WNL ABD: Soft, non tender to palpation, no rebound or guarding SVE: deferred   ED Course  Assessment: IUP at 36.4weeks Membranes: intact FHR: Category 1 CTX: None Nausea Prolong burping  Plan: Soap suds enema protonix PO   Jaielle Dlouhy, CNM, MSN 01/11/2015. 6:55 PM

## 2015-01-11 NOTE — Discharge Instructions (Signed)
Gastroesophageal Reflux Disease, Adult  Gastroesophageal reflux disease (GERD) happens when acid from your stomach flows up into the esophagus. When acid comes in contact with the esophagus, the acid causes soreness (inflammation) in the esophagus. Over time, GERD may create small holes (ulcers) in the lining of the esophagus.  CAUSES   · Increased body weight. This puts pressure on the stomach, making acid rise from the stomach into the esophagus.  · Smoking. This increases acid production in the stomach.  · Drinking alcohol. This causes decreased pressure in the lower esophageal sphincter (valve or ring of muscle between the esophagus and stomach), allowing acid from the stomach into the esophagus.  · Late evening meals and a full stomach. This increases pressure and acid production in the stomach.  · A malformed lower esophageal sphincter.  Sometimes, no cause is found.  SYMPTOMS   · Burning pain in the lower part of the mid-chest behind the breastbone and in the mid-stomach area. This may occur twice a week or more often.  · Trouble swallowing.  · Sore throat.  · Dry cough.  · Asthma-like symptoms including chest tightness, shortness of breath, or wheezing.  DIAGNOSIS   Your caregiver may be able to diagnose GERD based on your symptoms. In some cases, X-rays and other tests may be done to check for complications or to check the condition of your stomach and esophagus.  TREATMENT   Your caregiver may recommend over-the-counter or prescription medicines to help decrease acid production. Ask your caregiver before starting or adding any new medicines.   HOME CARE INSTRUCTIONS   · Change the factors that you can control. Ask your caregiver for guidance concerning weight loss, quitting smoking, and alcohol consumption.  · Avoid foods and drinks that make your symptoms worse, such as:  ¨ Caffeine or alcoholic drinks.  ¨ Chocolate.  ¨ Peppermint or mint flavorings.  ¨ Garlic and onions.  ¨ Spicy foods.  ¨ Citrus fruits,  such as oranges, lemons, or limes.  ¨ Tomato-based foods such as sauce, chili, salsa, and pizza.  ¨ Fried and fatty foods.  · Avoid lying down for the 3 hours prior to your bedtime or prior to taking a nap.  · Eat small, frequent meals instead of large meals.  · Wear loose-fitting clothing. Do not wear anything tight around your waist that causes pressure on your stomach.  · Raise the head of your bed 6 to 8 inches with wood blocks to help you sleep. Extra pillows will not help.  · Only take over-the-counter or prescription medicines for pain, discomfort, or fever as directed by your caregiver.  · Do not take aspirin, ibuprofen, or other nonsteroidal anti-inflammatory drugs (NSAIDs).  SEEK IMMEDIATE MEDICAL CARE IF:   · You have pain in your arms, neck, jaw, teeth, or back.  · Your pain increases or changes in intensity or duration.  · You develop nausea, vomiting, or sweating (diaphoresis).  · You develop shortness of breath, or you faint.  · Your vomit is green, yellow, black, or looks like coffee grounds or blood.  · Your stool is red, bloody, or black.  These symptoms could be signs of other problems, such as heart disease, gastric bleeding, or esophageal bleeding.  MAKE SURE YOU:   · Understand these instructions.  · Will watch your condition.  · Will get help right away if you are not doing well or get worse.  Document Released: 05/10/2005 Document Revised: 10/23/2011 Document Reviewed: 02/17/2011  ExitCare® Patient   Information ©2015 [employer]. This information is not intended to replace advice given to you by your health care provider. Make sure you discuss any questions you have with your health care provider.  Food Choices for Gastroesophageal Reflux Disease  When you have gastroesophageal reflux disease (GERD), the foods you eat and your eating habits are very important. Choosing the right foods can help ease the discomfort of GERD.  WHAT GENERAL GUIDELINES DO I NEED TO FOLLOW?  · Choose fruits,  vegetables, whole grains, low-fat dairy products, and low-fat meat, fish, and poultry.  · Limit fats such as oils, salad dressings, butter, nuts, and avocado.  · Keep a food diary to identify foods that cause symptoms.  · Avoid foods that cause reflux. These may be different for different people.  · Eat frequent small meals instead of three large meals each day.  · Eat your meals slowly, in a relaxed setting.  · Limit fried foods.  · Cook foods using methods other than frying.  · Avoid drinking alcohol.  · Avoid drinking large amounts of liquids with your meals.  · Avoid bending over or lying down until 2-3 hours after eating.  WHAT FOODS ARE NOT RECOMMENDED?  The following are some foods and drinks that may worsen your symptoms:  Vegetables  Tomatoes. Tomato juice. Tomato and spaghetti sauce. Chili peppers. Onion and garlic. Horseradish.  Fruits  Oranges, grapefruit, and lemon (fruit and juice).  Meats  High-fat meats, fish, and poultry. This includes hot dogs, ribs, ham, sausage, salami, and bacon.  Dairy  Whole milk and chocolate milk. Sour cream. Cream. Butter. Ice cream. Cream cheese.   Beverages  Coffee and tea, with or without caffeine. Carbonated beverages or energy drinks.  Condiments  Hot sauce. Barbecue sauce.   Sweets/Desserts  Chocolate and cocoa. Donuts. Peppermint and spearmint.  Fats and Oils  High-fat foods, including French fries and potato chips.  Other  Vinegar. Strong spices, such as black pepper, white pepper, red pepper, cayenne, curry powder, cloves, ginger, and chili powder.  The items listed above may not be a complete list of foods and beverages to avoid. Contact your dietitian for more information.  Document Released: 07/31/2005 Document Revised: 08/05/2013 Document Reviewed: 06/04/2013  ExitCare® Patient Information ©2015 [employer]. This information is not intended to replace advice given to you by your health care provider. Make sure you discuss any questions you have with your  health care provider.

## 2015-01-28 LAB — OB RESULTS CONSOLE GBS: GBS: POSITIVE

## 2015-01-31 ENCOUNTER — Inpatient Hospital Stay (HOSPITAL_COMMUNITY): Payer: Medicaid Other

## 2015-01-31 ENCOUNTER — Inpatient Hospital Stay (HOSPITAL_COMMUNITY)
Admission: AD | Admit: 2015-01-31 | Discharge: 2015-01-31 | Disposition: A | Discharge status: Home / Self Care | Payer: Medicaid Other | Source: Ambulatory Visit | Attending: Obstetrics and Gynecology | Admitting: Obstetrics and Gynecology

## 2015-01-31 ENCOUNTER — Encounter (HOSPITAL_COMMUNITY): Payer: Self-pay | Admitting: *Deleted

## 2015-01-31 DIAGNOSIS — O368130 Decreased fetal movements, third trimester, not applicable or unspecified: Secondary | ICD-10-CM | POA: Diagnosis present

## 2015-01-31 DIAGNOSIS — F419 Anxiety disorder, unspecified: Secondary | ICD-10-CM | POA: Insufficient documentation

## 2015-01-31 DIAGNOSIS — O99343 Other mental disorders complicating pregnancy, third trimester: Secondary | ICD-10-CM | POA: Diagnosis not present

## 2015-01-31 DIAGNOSIS — Z87891 Personal history of nicotine dependence: Secondary | ICD-10-CM | POA: Insufficient documentation

## 2015-01-31 DIAGNOSIS — Z3A39 39 weeks gestation of pregnancy: Secondary | ICD-10-CM | POA: Insufficient documentation

## 2015-01-31 DIAGNOSIS — O288 Other abnormal findings on antenatal screening of mother: Secondary | ICD-10-CM

## 2015-01-31 NOTE — MAU Provider Note (Signed)
History    Alicia Morgan is a 24y.o. G3P1011 at 39.3wks who presents, after phone call, for decreased fetal movement and vaginal bleeding. Patient reports fetus has not been moving, as before, and she no longer is suffering from pelvic pain which is of concern for her.  Patient states she had large meal at 1800 of 3 hamburgers, 1 hotdog, coleslaw, and water.  Patient states fetus was moving after meal.  However, patient states that while using the restroom she noted a "strip of blood" when she wiped and some scant blood in the toilet.  Patient was unsure if blood was mixed with mucous and denied issues with bowel movements and constipation.  Patient reports lots of anxiety and concerns regarding the remainder of pregnancy, induction, and labor.  Patient states "I want you to take the baby now."    Patient Active Problem List   Diagnosis Date Noted  . Reflux 12/28/2014  . Obesity--BMI 49 12/28/2014    No chief complaint on file.  HPI  OB History    Gravida Para Term Preterm AB TAB SAB Ectopic Multiple Living   Past Medical History  Diagnosis Date  . Anemia   . Depression   . Headache   . Obesity   . Chlamydia     Past Surgical History  Procedure Laterality Date  . Open reduction and internal fixation of ankle fracture.    . Fracture surgery      L tibia     History reviewed. No pertinent family history.  History  Substance Use Topics  . Smoking status: Former Smoker    Types: Cigars    Quit date: 07/14/2014  . Smokeless tobacco: Never Used  . Alcohol Use: No     Comment: not while pregnant     Allergies:  Allergies  Allergen Reactions  . Latex     rash    Prescriptions prior to admission  Medication Sig Dispense Refill Last Dose  . cyclobenzaprine (FLEXERIL) 10 MG tablet Take 10 mg by mouth 3 (three) times daily as needed for muscle spasms.   Past Week at Unknown time  . pantoprazole (PROTONIX) 20 MG tablet Take 1 tablet (20 mg  total) by mouth daily. May increase to twice daily, if necessary. 30 tablet 4 01/30/2015 at Unknown time  . Prenatal Vit-Fe Fumarate-FA (PRENATAL COMPLETE) 14-0.4 MG TABS Take 1 tablet by mouth daily. 60 each 0 01/31/2015 at Unknown time  . promethazine (PHENERGAN) 25 MG tablet Take 1 tablet (25 mg total) by mouth every 6 (six) hours as needed for nausea or vomiting. 36 tablet 1 Past Week at Unknown time  . ranitidine (ZANTAC) 75 MG tablet Take 75 mg by mouth 2 (two) times daily.     . sertraline (ZOLOFT) 25 MG tablet Take 25 mg by mouth daily.   01/30/2015 at Unknown time  . cetirizine (ZYRTEC) 10 MG tablet Take 10 mg by mouth daily as needed for allergies.   Past Week at Unknown time  . ondansetron (ZOFRAN) 4 MG tablet Take 1 tablet (4 mg total) by mouth every 6 (six) hours. 12 tablet 0 01/11/2015 at Unknown time    ROS  See HPI Above Physical Exam   Blood pressure 154/85, pulse 100, temperature 98.1 F (36.7 C), temperature source Oral, resp. rate 16, height  (1.727 m), weight 148.099 kg (326 lb 8 oz), last menstrual period 06/13/2014, SpO2 99 %.  No results found for this or any previous visit (from the past 24 hour(s)).  Physical Exam SVE: 1/60/-3  FHR:135 bpm, Mod Var, -Decels, +10x10 Accels UC: None graphed or palpated ED Course  Assessment: IUP at 39.3wks Cat I FT Anxiety NR NST Decreased FM  Plan: -Discussed cervical changes and presence of scant vaginal bleeding/bloody show a normal finding during this time -Educated on what to expect regarding Induction and Labor -Educated on perception of fetal movement in regards to sleep cycles, gestational age, and fetal position -BPP for NR NST  Follow Up (0320) -BPP 8/8 with AFI of 9.07 -Encouraged to call if any questions or concerns arise prior to next scheduled office visit.  -Keep appt as scheduled -Discharged to home in good condition  Lucinda Spells LYNN CNM, MSN 01/31/2015 2:21 AM

## 2015-01-31 NOTE — MAU Note (Signed)
Felt like was voiding but looked in toliet and saw blood and chunks of blood.  Bleeding like period and not feeling baby move for last hour.  No leaking fluid.

## 2015-01-31 NOTE — Discharge Instructions (Signed)
Fetal Movement Counts °Patient Name: __________________________________________________ Patient Due Date: ____________________ °Performing a fetal movement count is highly recommended in high-risk pregnancies, but it is good for every pregnant woman to do. Your health care provider may ask you to start counting fetal movements at 28 weeks of the pregnancy. Fetal movements often increase: °· After eating a full meal. °· After physical activity. °· After eating or drinking something sweet or cold. °· At rest. °Pay attention to when you feel the baby is most active. This will help you notice a pattern of your baby's sleep and wake cycles and what factors contribute to an increase in fetal movement. It is important to perform a fetal movement count at the same time each day when your baby is normally most active.  °HOW TO COUNT FETAL MOVEMENTS °1. Find a quiet and comfortable area to sit or lie down on your left side. Lying on your left side provides the best blood and oxygen circulation to your baby. °2. Write down the day and time on a sheet of paper or in a journal. °3. Start counting kicks, flutters, swishes, rolls, or jabs in a 2-hour period. You should feel at least 10 movements within 2 hours. °4. If you do not feel 10 movements in 2 hours, wait 2-3 hours and count again. Look for a change in the pattern or not enough counts in 2 hours. °SEEK MEDICAL CARE IF: °· You feel less than 10 counts in 2 hours, tried twice. °· There is no movement in over an hour. °· The pattern is changing or taking longer each day to reach 10 counts in 2 hours. °· You feel the baby is not moving as he or she usually does. °Date: ____________ Movements: ____________ Start time: ____________ Finish time: ____________  °Date: ____________ Movements: ____________ Start time: ____________ Finish time: ____________ °Date: ____________ Movements: ____________ Start time: ____________ Finish time: ____________ °Date: ____________ Movements:  ____________ Start time: ____________ Finish time: ____________ °Date: ____________ Movements: ____________ Start time: ____________ Finish time: ____________ °Date: ____________ Movements: ____________ Start time: ____________ Finish time: ____________ °Date: ____________ Movements: ____________ Start time: ____________ Finish time: ____________ °Date: ____________ Movements: ____________ Start time: ____________ Finish time: ____________  °Date: ____________ Movements: ____________ Start time: ____________ Finish time: ____________ °Date: ____________ Movements: ____________ Start time: ____________ Finish time: ____________ °Date: ____________ Movements: ____________ Start time: ____________ Finish time: ____________ °Date: ____________ Movements: ____________ Start time: ____________ Finish time: ____________ °Date: ____________ Movements: ____________ Start time: ____________ Finish time: ____________ °Date: ____________ Movements: ____________ Start time: ____________ Finish time: ____________ °Date: ____________ Movements: ____________ Start time: ____________ Finish time: ____________  °Date: ____________ Movements: ____________ Start time: ____________ Finish time: ____________ °Date: ____________ Movements: ____________ Start time: ____________ Finish time: ____________ °Date: ____________ Movements: ____________ Start time: ____________ Finish time: ____________ °Date: ____________ Movements: ____________ Start time: ____________ Finish time: ____________ °Date: ____________ Movements: ____________ Start time: ____________ Finish time: ____________ °Date: ____________ Movements: ____________ Start time: ____________ Finish time: ____________ °Date: ____________ Movements: ____________ Start time: ____________ Finish time: ____________  °Date: ____________ Movements: ____________ Start time: ____________ Finish time: ____________ °Date: ____________ Movements: ____________ Start time: ____________ Finish  time: ____________ °Date: ____________ Movements: ____________ Start time: ____________ Finish time: ____________ °Date: ____________ Movements: ____________ Start time: ____________ Finish time: ____________ °Date: ____________ Movements: ____________ Start time: ____________ Finish time: ____________ °Date: ____________ Movements: ____________ Start time: ____________ Finish time: ____________ °Date: ____________ Movements: ____________ Start time: ____________ Finish time: ____________  °Date: ____________ Movements: ____________ Start time: ____________ Finish   time: ____________ °Date: ____________ Movements: ____________ Start time: ____________ Finish time: ____________ °Date: ____________ Movements: ____________ Start time: ____________ Finish time: ____________ °Date: ____________ Movements: ____________ Start time: ____________ Finish time: ____________ °Date: ____________ Movements: ____________ Start time: ____________ Finish time: ____________ °Date: ____________ Movements: ____________ Start time: ____________ Finish time: ____________ °Date: ____________ Movements: ____________ Start time: ____________ Finish time: ____________  °Date: ____________ Movements: ____________ Start time: ____________ Finish time: ____________ °Date: ____________ Movements: ____________ Start time: ____________ Finish time: ____________ °Date: ____________ Movements: ____________ Start time: ____________ Finish time: ____________ °Date: ____________ Movements: ____________ Start time: ____________ Finish time: ____________ °Date: ____________ Movements: ____________ Start time: ____________ Finish time: ____________ °Date: ____________ Movements: ____________ Start time: ____________ Finish time: ____________ °Date: ____________ Movements: ____________ Start time: ____________ Finish time: ____________  °Date: ____________ Movements: ____________ Start time: ____________ Finish time: ____________ °Date: ____________  Movements: ____________ Start time: ____________ Finish time: ____________ °Date: ____________ Movements: ____________ Start time: ____________ Finish time: ____________ °Date: ____________ Movements: ____________ Start time: ____________ Finish time: ____________ °Date: ____________ Movements: ____________ Start time: ____________ Finish time: ____________ °Date: ____________ Movements: ____________ Start time: ____________ Finish time: ____________ °Date: ____________ Movements: ____________ Start time: ____________ Finish time: ____________  °Date: ____________ Movements: ____________ Start time: ____________ Finish time: ____________ °Date: ____________ Movements: ____________ Start time: ____________ Finish time: ____________ °Date: ____________ Movements: ____________ Start time: ____________ Finish time: ____________ °Date: ____________ Movements: ____________ Start time: ____________ Finish time: ____________ °Date: ____________ Movements: ____________ Start time: ____________ Finish time: ____________ °Date: ____________ Movements: ____________ Start time: ____________ Finish time: ____________ °Document Released: 08/30/2006 Document Revised: 12/15/2013 Document Reviewed: 05/27/2012 °[employer]® Patient Information ©2015 [employer]. This information is not intended to replace advice given to you by your health care provider. Make sure you discuss any questions you have with your health care provider. °Braxton Hicks Contractions °Contractions of the uterus can occur throughout pregnancy. Contractions are not always a sign that you are in labor.  °WHAT ARE BRAXTON HICKS CONTRACTIONS?  °Contractions that occur before labor are called Braxton Hicks contractions, or false labor. Toward the end of pregnancy (32-34 weeks), these contractions can develop more often and may become more forceful. This is not true labor because these contractions do not result in opening (dilatation) and thinning of the cervix. They  are sometimes difficult to tell apart from true labor because these contractions can be forceful and people have different pain tolerances. You should not feel embarrassed if you go to the hospital with false labor. Sometimes, the only way to tell if you are in true labor is for your health care provider to look for changes in the cervix. °If there are no prenatal problems or other health problems associated with the pregnancy, it is completely safe to be sent home with false labor and await the onset of true labor. °HOW CAN YOU TELL THE DIFFERENCE BETWEEN TRUE AND FALSE LABOR? °False Labor °· The contractions of false labor are usually shorter and not as hard as those of true labor.   °· The contractions are usually irregular.   °· The contractions are often felt in the front of the lower abdomen and in the groin.   °· The contractions may go away when you walk around or change positions while lying down.   °· The contractions get weaker and are shorter lasting as time goes on.   °· The contractions do not usually become progressively stronger, regular, and closer together as with true labor.   °True Labor °5. Contractions in true labor last 30-70 seconds, become   very regular, usually become more intense, and increase in frequency.   °6. The contractions do not go away with walking.   °7. The discomfort is usually felt in the top of the uterus and spreads to the lower abdomen and low back.   °8. True labor can be determined by your health care provider with an exam. This will show that the cervix is dilating and getting thinner.   °WHAT TO REMEMBER °· Keep up with your usual exercises and follow other instructions given by your health care provider.   °· Take medicines as directed by your health care provider.   °· Keep your regular prenatal appointments.   °· Eat and drink lightly if you think you are going into labor.   °· If Braxton Hicks contractions are making you uncomfortable:   °· Change your position from  lying down or resting to walking, or from walking to resting.   °· Sit and rest in a tub of warm water.   °· Drink 2-3 glasses of water. Dehydration may cause these contractions.   °· Do slow and deep breathing several times an hour.   °WHEN SHOULD I SEEK IMMEDIATE MEDICAL CARE? °Seek immediate medical care if: °· Your contractions become stronger, more regular, and closer together.   °· You have fluid leaking or gushing from your vagina.   °· You have a fever.   °· You pass blood-tinged mucus.   °· You have vaginal bleeding.   °· You have continuous abdominal pain.   °· You have low back pain that you never had before.   °· You feel your baby's head pushing down and causing pelvic pressure.   °· Your baby is not moving as much as it used to.   °Document Released: 07/31/2005 Document Revised: 08/05/2013 Document Reviewed: 05/12/2013 °Exit[employer]® Patient Information ©2015 [employer]. This information is not intended to replace advice given to you by your health care provider. Make sure you discuss any questions you have with your health care provider. ° °

## 2015-02-03 ENCOUNTER — Inpatient Hospital Stay (HOSPITAL_COMMUNITY)
Admission: AD | Admit: 2015-02-03 | Discharge: 2015-02-05 | DRG: 775 | Disposition: A | Discharge status: Home / Self Care | Payer: Medicaid Other | Source: Ambulatory Visit | Attending: Obstetrics and Gynecology | Admitting: Obstetrics and Gynecology

## 2015-02-03 ENCOUNTER — Inpatient Hospital Stay (HOSPITAL_COMMUNITY): Payer: Medicaid Other | Admitting: Anesthesiology

## 2015-02-03 ENCOUNTER — Encounter (HOSPITAL_COMMUNITY): Payer: Self-pay

## 2015-02-03 DIAGNOSIS — G43909 Migraine, unspecified, not intractable, without status migrainosus: Secondary | ICD-10-CM

## 2015-02-03 DIAGNOSIS — O99214 Obesity complicating childbirth: Secondary | ICD-10-CM | POA: Diagnosis present

## 2015-02-03 DIAGNOSIS — Z6841 Body Mass Index (BMI) 40.0 and over, adult: Secondary | ICD-10-CM | POA: Diagnosis not present

## 2015-02-03 DIAGNOSIS — Z3483 Encounter for supervision of other normal pregnancy, third trimester: Secondary | ICD-10-CM | POA: Diagnosis present

## 2015-02-03 DIAGNOSIS — A630 Anogenital (venereal) warts: Secondary | ICD-10-CM

## 2015-02-03 DIAGNOSIS — Z3A39 39 weeks gestation of pregnancy: Secondary | ICD-10-CM | POA: Diagnosis present

## 2015-02-03 DIAGNOSIS — O139 Gestational [pregnancy-induced] hypertension without significant proteinuria, unspecified trimester: Secondary | ICD-10-CM

## 2015-02-03 DIAGNOSIS — Z202 Contact with and (suspected) exposure to infections with a predominantly sexual mode of transmission: Secondary | ICD-10-CM

## 2015-02-03 DIAGNOSIS — J302 Other seasonal allergic rhinitis: Secondary | ICD-10-CM

## 2015-02-03 DIAGNOSIS — F329 Major depressive disorder, single episode, unspecified: Secondary | ICD-10-CM

## 2015-02-03 DIAGNOSIS — F32A Depression, unspecified: Secondary | ICD-10-CM

## 2015-02-03 DIAGNOSIS — Z91040 Latex allergy status: Secondary | ICD-10-CM

## 2015-02-03 DIAGNOSIS — IMO0001 Reserved for inherently not codable concepts without codable children: Secondary | ICD-10-CM

## 2015-02-03 DIAGNOSIS — O133 Gestational [pregnancy-induced] hypertension without significant proteinuria, third trimester: Principal | ICD-10-CM | POA: Diagnosis present

## 2015-02-03 DIAGNOSIS — T7421XA Adult sexual abuse, confirmed, initial encounter: Secondary | ICD-10-CM

## 2015-02-03 DIAGNOSIS — O99824 Streptococcus B carrier state complicating childbirth: Secondary | ICD-10-CM | POA: Diagnosis present

## 2015-02-03 DIAGNOSIS — IMO0002 Reserved for concepts with insufficient information to code with codable children: Secondary | ICD-10-CM

## 2015-02-03 DIAGNOSIS — O98319 Other infections with a predominantly sexual mode of transmission complicating pregnancy, unspecified trimester: Secondary | ICD-10-CM

## 2015-02-03 HISTORY — DX: Gestational (pregnancy-induced) hypertension without significant proteinuria, unspecified trimester: O13.9

## 2015-02-03 LAB — COMPREHENSIVE METABOLIC PANEL
ALK PHOS: 222 U/L — AB (ref 38–126)
ALT: 19 U/L (ref 14–54)
ANION GAP: 8 (ref 5–15)
AST: 24 U/L (ref 15–41)
Albumin: 2.9 g/dL — ABNORMAL LOW (ref 3.5–5.0)
BILIRUBIN TOTAL: 0.4 mg/dL (ref 0.3–1.2)
BUN: 8 mg/dL (ref 6–20)
CALCIUM: 8.9 mg/dL (ref 8.9–10.3)
CO2: 20 mmol/L — ABNORMAL LOW (ref 22–32)
Chloride: 107 mmol/L (ref 101–111)
Creatinine, Ser: 0.55 mg/dL (ref 0.44–1.00)
GFR calc Af Amer: >60 mL/min (ref >60)
GFR calc non Af Amer: >60 mL/min (ref >60)
GLUCOSE: 128 mg/dL — AB (ref 65–99)
POTASSIUM: 3.9 mmol/L (ref 3.5–5.1)
Sodium: 135 mmol/L (ref 135–145)
Total Protein: 6.6 g/dL (ref 6.5–8.1)

## 2015-02-03 LAB — CBC
HEMATOCRIT: 32.6 % — AB (ref 36.0–46.0)
HEMOGLOBIN: 11 g/dL — AB (ref 12.0–15.0)
MCH: 27.6 pg (ref 26.0–34.0)
MCHC: 33.7 g/dL (ref 30.0–36.0)
MCV: 81.9 fL (ref 78.0–100.0)
Platelets: 293 10*3/uL (ref 150–400)
RBC: 3.98 MIL/uL (ref 3.87–5.11)
RDW: 14.5 % (ref 11.5–15.5)
WBC: 9.8 10*3/uL (ref 4.0–10.5)

## 2015-02-03 LAB — TYPE AND SCREEN
ABO/RH(D): O POS
ANTIBODY SCREEN: NEGATIVE

## 2015-02-03 LAB — PROTEIN / CREATININE RATIO, URINE
Creatinine, Urine: 262 mg/dL
Protein Creatinine Ratio: 0.14 mg/mg{Cre} (ref 0.00–0.15)
Total Protein, Urine: 37 mg/dL

## 2015-02-03 LAB — LACTATE DEHYDROGENASE: LDH: 196 U/L — AB (ref 98–192)

## 2015-02-03 LAB — URIC ACID: Uric Acid, Serum: 4.4 mg/dL (ref 2.3–6.6)

## 2015-02-03 LAB — RPR: RPR: NONREACTIVE

## 2015-02-03 MED ORDER — FENTANYL 2.5 MCG/ML BUPIVACAINE 1/10 % EPIDURAL INFUSION (WH - ANES): 14.0000 mL/h | INTRAMUSCULAR | Status: DC | PRN

## 2015-02-03 MED ORDER — FLEET ENEMA 7-19 GM/118ML RE ENEM: 1.0000 | ENEMA | RECTAL | Status: DC | PRN

## 2015-02-03 MED ORDER — HYDROXYZINE HCL 50 MG PO TABS
50.0000 mg | ORAL_TABLET | Freq: Four times a day (QID) | ORAL | Status: DC | PRN
  Filled 2015-02-03: qty 1

## 2015-02-03 MED ORDER — IBUPROFEN 600 MG PO TABS
600.0000 mg | ORAL_TABLET | Freq: Four times a day (QID) | ORAL | Status: DC
  Administered 2015-02-03 – 2015-02-05 (×9): 600 mg via ORAL
  Filled 2015-02-03 (×9): qty 1

## 2015-02-03 MED ORDER — HYDRALAZINE HCL 20 MG/ML IJ SOLN: 10.0000 mg | Freq: Once | INTRAMUSCULAR | Status: DC | PRN

## 2015-02-03 MED ORDER — PHENYLEPHRINE 40 MCG/ML (10ML) SYRINGE FOR IV PUSH (FOR BLOOD PRESSURE SUPPORT)
PREFILLED_SYRINGE | INTRAVENOUS | Status: AC
  Filled 2015-02-03: qty 20

## 2015-02-03 MED ORDER — EPHEDRINE 5 MG/ML INJ
10.0000 mg | INTRAVENOUS | Status: DC | PRN
  Filled 2015-02-03: qty 2

## 2015-02-03 MED ORDER — NALBUPHINE HCL 10 MG/ML IJ SOLN
10.0000 mg | INTRAMUSCULAR | Status: DC | PRN
  Administered 2015-02-03: 10 mg via INTRAVENOUS
  Filled 2015-02-03: qty 1

## 2015-02-03 MED ORDER — TETANUS-DIPHTH-ACELL PERTUSSIS 5-2.5-18.5 LF-MCG/0.5 IM SUSP: 0.5000 mL | Freq: Once | INTRAMUSCULAR | Status: DC

## 2015-02-03 MED ORDER — OXYTOCIN 40 UNITS IN LACTATED RINGERS INFUSION - SIMPLE MED
1.0000 m[IU]/min | INTRAVENOUS | Status: DC
  Administered 2015-02-03: 2 m[IU]/min via INTRAVENOUS
  Filled 2015-02-03: qty 1000

## 2015-02-03 MED ORDER — PENICILLIN G POTASSIUM 5000000 UNITS IJ SOLR
2.5000 10*6.[IU] | INTRAVENOUS | Status: DC
  Filled 2015-02-03 (×3): qty 2.5

## 2015-02-03 MED ORDER — ONDANSETRON HCL 4 MG PO TABS: 4.0000 mg | ORAL_TABLET | ORAL | Status: DC | PRN

## 2015-02-03 MED ORDER — CITRIC ACID-SODIUM CITRATE 334-500 MG/5ML PO SOLN: 30.0000 mL | ORAL | Status: DC | PRN

## 2015-02-03 MED ORDER — LABETALOL HCL 5 MG/ML IV SOLN: 20.0000 mg | INTRAVENOUS | Status: DC | PRN

## 2015-02-03 MED ORDER — WITCH HAZEL-GLYCERIN EX PADS: 1.0000 "application " | MEDICATED_PAD | CUTANEOUS | Status: DC | PRN

## 2015-02-03 MED ORDER — FENTANYL 2.5 MCG/ML BUPIVACAINE 1/10 % EPIDURAL INFUSION (WH - ANES)
INTRAMUSCULAR | Status: AC
  Administered 2015-02-03: 14 mL/h via EPIDURAL
  Filled 2015-02-03: qty 125

## 2015-02-03 MED ORDER — ACETAMINOPHEN 325 MG PO TABS: 650.0000 mg | ORAL_TABLET | ORAL | Status: DC | PRN

## 2015-02-03 MED ORDER — DIBUCAINE 1 % RE OINT: 1.0000 "application " | TOPICAL_OINTMENT | RECTAL | Status: DC | PRN

## 2015-02-03 MED ORDER — LACTATED RINGERS IV SOLN
INTRAVENOUS | Status: DC
  Administered 2015-02-03: 125 mL/h via INTRAVENOUS

## 2015-02-03 MED ORDER — TERBUTALINE SULFATE 1 MG/ML IJ SOLN
0.2500 mg | Freq: Once | INTRAMUSCULAR | Status: DC | PRN
  Filled 2015-02-03: qty 1

## 2015-02-03 MED ORDER — OXYTOCIN 40 UNITS IN LACTATED RINGERS INFUSION - SIMPLE MED: 62.5000 mL/h | INTRAVENOUS | Status: DC

## 2015-02-03 MED ORDER — SENNOSIDES-DOCUSATE SODIUM 8.6-50 MG PO TABS
2.0000 | ORAL_TABLET | ORAL | Status: DC
  Administered 2015-02-03 – 2015-02-04 (×2): 2 via ORAL
  Filled 2015-02-03 (×2): qty 2

## 2015-02-03 MED ORDER — OXYCODONE-ACETAMINOPHEN 5-325 MG PO TABS
1.0000 | ORAL_TABLET | ORAL | Status: DC | PRN
  Administered 2015-02-03: 1 via ORAL
  Filled 2015-02-03: qty 1

## 2015-02-03 MED ORDER — LACTATED RINGERS IV SOLN
500.0000 mL | INTRAVENOUS | Status: DC | PRN
  Administered 2015-02-03: 500 mL via INTRAVENOUS

## 2015-02-03 MED ORDER — LIDOCAINE HCL (PF) 1 % IJ SOLN
30.0000 mL | INTRAMUSCULAR | Status: DC | PRN
  Filled 2015-02-03: qty 30

## 2015-02-03 MED ORDER — ZOLPIDEM TARTRATE 5 MG PO TABS: 5.0000 mg | ORAL_TABLET | Freq: Every evening | ORAL | Status: DC | PRN

## 2015-02-03 MED ORDER — ONDANSETRON HCL 4 MG/2ML IJ SOLN: 4.0000 mg | Freq: Four times a day (QID) | INTRAMUSCULAR | Status: DC | PRN

## 2015-02-03 MED ORDER — OXYCODONE-ACETAMINOPHEN 5-325 MG PO TABS
1.0000 | ORAL_TABLET | ORAL | Status: DC | PRN
  Administered 2015-02-03 – 2015-02-05 (×6): 1 via ORAL
  Filled 2015-02-03 (×6): qty 1

## 2015-02-03 MED ORDER — FERROUS SULFATE 325 (65 FE) MG PO TABS
325.0000 mg | ORAL_TABLET | Freq: Two times a day (BID) | ORAL | Status: DC
  Administered 2015-02-03 – 2015-02-05 (×4): 325 mg via ORAL
  Filled 2015-02-03 (×4): qty 1

## 2015-02-03 MED ORDER — DIPHENHYDRAMINE HCL 25 MG PO CAPS: 25.0000 mg | ORAL_CAPSULE | Freq: Four times a day (QID) | ORAL | Status: DC | PRN

## 2015-02-03 MED ORDER — SIMETHICONE 80 MG PO CHEW
80.0000 mg | CHEWABLE_TABLET | ORAL | Status: DC | PRN
  Administered 2015-02-03: 80 mg via ORAL
  Filled 2015-02-03: qty 1

## 2015-02-03 MED ORDER — DIPHENHYDRAMINE HCL 50 MG/ML IJ SOLN: 12.5000 mg | INTRAMUSCULAR | Status: DC | PRN

## 2015-02-03 MED ORDER — OXYCODONE-ACETAMINOPHEN 5-325 MG PO TABS: 2.0000 | ORAL_TABLET | ORAL | Status: DC | PRN

## 2015-02-03 MED ORDER — LIDOCAINE HCL (PF) 1 % IJ SOLN
INTRAMUSCULAR | Status: DC | PRN
  Administered 2015-02-03: 4 mL
  Administered 2015-02-03: 6 mL

## 2015-02-03 MED ORDER — BENZOCAINE-MENTHOL 20-0.5 % EX AERO: 1.0000 "application " | INHALATION_SPRAY | CUTANEOUS | Status: DC | PRN

## 2015-02-03 MED ORDER — PRENATAL MULTIVITAMIN CH
1.0000 | ORAL_TABLET | Freq: Every day | ORAL | Status: DC
  Administered 2015-02-04 – 2015-02-05 (×2): 1 via ORAL
  Filled 2015-02-03 (×2): qty 1

## 2015-02-03 MED ORDER — OXYTOCIN BOLUS FROM INFUSION
500.0000 mL | INTRAVENOUS | Status: DC
  Administered 2015-02-03: 500 mL via INTRAVENOUS

## 2015-02-03 MED ORDER — LANOLIN HYDROUS EX OINT: TOPICAL_OINTMENT | CUTANEOUS | Status: DC | PRN

## 2015-02-03 MED ORDER — PHENYLEPHRINE 40 MCG/ML (10ML) SYRINGE FOR IV PUSH (FOR BLOOD PRESSURE SUPPORT)
80.0000 ug | PREFILLED_SYRINGE | INTRAVENOUS | Status: DC | PRN
  Filled 2015-02-03: qty 2

## 2015-02-03 MED ORDER — ONDANSETRON HCL 4 MG/2ML IJ SOLN: 4.0000 mg | INTRAMUSCULAR | Status: DC | PRN

## 2015-02-03 MED ORDER — PENICILLIN G POTASSIUM 5000000 UNITS IJ SOLR
5.0000 10*6.[IU] | Freq: Once | INTRAVENOUS | Status: AC
  Administered 2015-02-03: 5 10*6.[IU] via INTRAVENOUS
  Filled 2015-02-03: qty 5

## 2015-02-03 NOTE — Anesthesia Postprocedure Evaluation (Signed)
  Anesthesia Post-op Note  Patient: Alicia Morgan  Procedure(s) Performed: * No procedures listed *  Patient Location: Mother/Baby  Anesthesia Type:Epidural  Level of Consciousness: awake, alert  and patient cooperative  Airway and Oxygen Therapy: Patient Spontanous Breathing  Post-op Pain: none  Post-op Assessment: Post-op Vital signs reviewed, Patient's Cardiovascular Status Stable, Respiratory Function Stable, Patent Airway, No signs of Nausea or vomiting, Adequate PO intake, Pain level controlled, No headache, No backache and Patient able to bend at knees              Post-op Vital Signs: Reviewed and stable  Last Vitals:  Filed Vitals:   02/03/15 0705  BP: 143/78  Pulse: 71  Temp:   Resp: 18    Complications: No apparent anesthesia complications

## 2015-02-03 NOTE — Progress Notes (Addendum)
S: Requests AROM despite inadequate treatment for GBS. Feeling a lot of pressure. Received epidural PCA dose x 1.  O:  Today's Vitals   02/03/15 0421 02/03/15 0429 02/03/15 0440 02/03/15 0500  BP:   135/76 133/74  Pulse:   82 87  Temp:    98.5 F (36.9 C)  TempSrc:    Oral  Resp:      Height:      Weight:      SpO2:      PainSc: 0-No pain 0-No pain     FHRT: BL 140 w/ min variability, no accels, no decels Ctxs: q 3-5 min Cvx: 9/C/0  A: Gestational HTN Results for orders placed or performed during the hospital encounter of 02/03/15 (from the past 24 hour(s))  CBC     Status: Abnormal   Collection Time: 02/03/15  1:45 AM  Result Value Ref Range   WBC 9.8 4.0 - 10.5 K/uL   RBC 3.98 3.87 - 5.11 MIL/uL   Hemoglobin 11.0 (L) 12.0 - 15.0 g/dL   HCT 30.8 (L) 65.7 - 84.6 %   MCV 81.9 78.0 - 100.0 fL   MCH 27.6 26.0 - 34.0 pg   MCHC 33.7 30.0 - 36.0 g/dL   RDW 96.2 95.2 - 84.1 %   Platelets 293 150 - 400 K/uL  Type and screen     Status: None   Collection Time: 02/03/15  1:45 AM  Result Value Ref Range   ABO/RH(D) O POS    Antibody Screen NEG    Sample Expiration 02/06/2015   RPR     Status: None   Collection Time: 02/03/15  1:45 AM  Result Value Ref Range   RPR Ser Ql Non Reactive Non Reactive  Comprehensive metabolic panel     Status: Abnormal   Collection Time: 02/03/15  1:45 AM  Result Value Ref Range   Sodium 135 135 - 145 mmol/L   Potassium 3.9 3.5 - 5.1 mmol/L   Chloride 107 101 - 111 mmol/L   CO2 20 (L) 22 - 32 mmol/L   Glucose, Bld 128 (H) 65 - 99 mg/dL   BUN 8 6 - 20 mg/dL   Creatinine, Ser 3.24 0.44 - 1.00 mg/dL   Calcium 8.9 8.9 - 40.1 mg/dL   Total Protein 6.6 6.5 - 8.1 g/dL   Albumin 2.9 (L) 3.5 - 5.0 g/dL   AST 24 15 - 41 U/L   ALT 19 14 - 54 U/L   Alkaline Phosphatase 222 (H) 38 - 126 U/L   Total Bilirubin 0.4 0.3 - 1.2 mg/dL   GFR calc non Af Amer >60 >60 mL/min   GFR calc Af Amer >60 >60 mL/min   Anion gap 8 5 - 15  Lactate dehydrogenase      Status: Abnormal   Collection Time: 02/03/15  1:45 AM  Result Value Ref Range   LDH 196 (H) 98 - 192 U/L  Uric acid     Status: None   Collection Time: 02/03/15  1:45 AM  Result Value Ref Range   Uric Acid, Serum 4.4 2.3 - 6.6 mg/dL  Protein / creatinine ratio, urine     Status: None   Collection Time: 02/03/15  5:32 AM  Result Value Ref Range   Creatinine, Urine 262.00 mg/dL   Total Protein, Urine 37 mg/dL   Protein Creatinine Ratio 0.14 0.00 - 0.15 mg/mg[Cre]   AROM'd, moderate amount of meconium.  P: Expect SVD briefly.  Sherre Scarlet, CNM 02/03/15, 05:23 AM  ADDENDUM: Involuntarily pushing. Cvx noted to be C/C/+3 at 05:28 PM.   P: Prepare for imminent delivery.  Sherre Scarlet, CNM 02/03/15, 05:30 AM
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
## 2015-02-03 NOTE — Anesthesia Procedure Notes (Signed)
Epidural Patient location during procedure: OB  Preanesthetic Checklist Completed: patient identified, site marked, surgical consent, pre-op evaluation, timeout performed, IV checked, risks and benefits discussed and monitors and equipment checked  Epidural Patient position: sitting Prep: site prepped and draped and DuraPrep Patient monitoring: continuous pulse ox and blood pressure Approach: midline Location: L3-L4 Injection technique: LOR air  Needle:  Needle type: Tuohy  Needle gauge: 17 G Needle length: 9 cm and 9 Needle insertion depth: 7 cm Catheter type: closed end flexible Catheter size: 19 Gauge Catheter at skin depth: 15 cm Test dose: negative  Assessment Events: blood not aspirated, injection not painful, no injection resistance, negative IV test and no paresthesia  Additional Notes Dosing of Epidural:  1st dose, through catheter .............................................  Xylocaine 40 mg  2nd dose, through catheter, after waiting 3 minutes.........Xylocaine 60 mg    ( 1% Xylo charted as a single dose in Epic Meds for ease of charting; actual dosing was fractionated as above, for saftey's sake)  As each dose occurred, patient was free of IV sx; and patient exhibited no evidence of SA injection.  Patient is more comfortable after epidural dosed. Please see RN's note for documentation of vital signs,and FHR which are stable.  Patient reminded not to try to ambulate with numb legs, and that an RN must be present when she attempts to get up.       
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
## 2015-02-03 NOTE — Lactation Note (Signed)
This note was copied from the chart of Alicia Deannah Rupert. Lactation Consultation Note Attempted visit at 14 hours of age.  Mom has visitors and will call later for [facility] visit.   Patient Name: Alicia Morgan [id number] Date: 02/03/2015     Maternal Data    Feeding    LATCH Score/Interventions                      Lactation Tools Discussed/Used     Consult Status      Shoptaw, Arvella Merles 02/03/2015, 8:35 PM

## 2015-02-03 NOTE — Progress Notes (Signed)
This note also relates to the following rows which could not be included: Dose (milli-units/min) Oxytocin - Cannot attach notes to extension rows Rate (mL/hr) Oxytocin - Cannot attach notes to extension rows Concentration Oxytocin - Cannot attach notes to extension rows  Kim williams cnm ordered to stop pitocin at this time.

## 2015-02-03 NOTE — MAU Note (Signed)
Pt complains of contractions all day. Has not timed them. Denies LOF or bleeding. +FM.

## 2015-02-03 NOTE — Anesthesia Preprocedure Evaluation (Signed)
Anesthesia Evaluation  Patient identified by MRN, date of birth, ID band Patient awake    Reviewed: Allergy & Precautions, H&P , Patient's Chart, lab work & pertinent test results  Airway Mallampati: II  TM Distance: >3 FB Neck ROM: full    Dental  (+) Teeth Intact   Pulmonary former smoker,    breath sounds clear to auscultation       Cardiovascular hypertension,  Rhythm:regular Rate:Normal     Neuro/Psych    GI/Hepatic   Endo/Other  Morbid obesity  Renal/GU      Musculoskeletal   Abdominal   Peds  Hematology   Anesthesia Other Findings       Reproductive/Obstetrics (+) Pregnancy                             Anesthesia Physical Anesthesia Plan  ASA: III  Anesthesia Plan: Epidural   Post-op Pain Management:    Induction:   Airway Management Planned:   Additional Equipment:   Intra-op Plan:   Post-operative Plan:   Informed Consent: I have reviewed the patients History and Physical, chart, labs and discussed the procedure including the risks, benefits and alternatives for the proposed anesthesia with the patient or authorized representative who has indicated his/her understanding and acceptance.   Dental Advisory Given  Plan Discussed with:   Anesthesia Plan Comments: (Labs checked- platelets confirmed with RN in room. Fetal heart tracing, per RN, reported to be stable enough for sitting procedure. Discussed epidural, and patient consents to the procedure:  included risk of possible headache,backache, failed block, allergic reaction, and nerve injury. This patient was asked if she had any questions or concerns before the procedure started.)        Anesthesia Quick Evaluation  

## 2015-02-03 NOTE — Progress Notes (Addendum)
Patient is requesting TED hose to help with swelling.  Called [employer] and received the order to order her some.   Called materials and they are bringing her some up.

## 2015-02-03 NOTE — Lactation Note (Signed)
This note was copied from the chart of Alicia Morgan. Lactation Consultation Note  Initial visit at 15 hours of age.  [facility] RN reports assisting with a good latch.  When I entered the room baby is latched, but mom reports he is not on deeply.   Mom has very pendulous large breasts/areolas and  Baby requires mom to hold breast stable in baby's mouth.  Mom tends to let go of breast.  Mom reports 2 years of breastfeeding and denies any problems.  Mom reports this baby doesn't open mouth wide for her large nipples.  Discussed positioning in cross cradle and waiting for wide open mouth.  Encouraged to allow nose chin and cheeks to touch breast.  Baby on and off the breast several times and mom is very sleepy.  Pillows used for support and encouraged mom to keep practicing.  [facility] LC resources given and discussed.  Encouraged to feed with early cues on demand.  Early newborn behavior discussed.  Hand expression demonstrated with colostrum visible.  Mom to call for assist as needed.      Patient Name: Alicia Morgan [id number] Date: 02/03/2015 Reason for consult: Initial assessment   Maternal Data Has patient been taught Hand Expression?: Yes Does the patient have breastfeeding experience prior to this delivery?: No  Feeding Feeding Type: Breast Fed Length of feed:  (lready latched observed 10 minutes)  LATCH Score/Interventions Latch: Grasps breast easily, tongue down, lips flanged, rhythmical sucking.  Audible Swallowing: A few with stimulation  Type of Nipple: Everted at rest and after stimulation  Comfort (Breast/Nipple): Soft / non-tender     Hold (Positioning): Assistance needed to correctly position infant at breast and maintain latch. Intervention(s): Breastfeeding basics reviewed;Support Pillows;Position options;Skin to skin  LATCH Score: 8  Lactation Tools Discussed/Used WIC Program: No   Consult Status Consult Status: Follow-up Date: 02/04/15 Follow-up type:  In-patient    [name] 02/03/2015, 9:32 PM

## 2015-02-04 LAB — CBC
HCT: 29.7 % — ABNORMAL LOW (ref 36.0–46.0)
Hemoglobin: 9.9 g/dL — ABNORMAL LOW (ref 12.0–15.0)
MCH: 27.6 pg (ref 26.0–34.0)
MCHC: 33.3 g/dL (ref 30.0–36.0)
MCV: 82.7 fL (ref 78.0–100.0)
Platelets: 273 10*3/uL (ref 150–400)
RBC: 3.59 MIL/uL — AB (ref 3.87–5.11)
RDW: 14.7 % (ref 11.5–15.5)
WBC: 10.6 10*3/uL — AB (ref 4.0–10.5)

## 2015-02-04 NOTE — Progress Notes (Signed)
CSW continues to put forth effort to meet with [name] to complete psychosocial assessment.  CSW had 3 unsuccessful attempts.    On first attempt, [name] was sleeping.  On second attempt, [name] had numerous visitors in her room. On third attempt, she continued to have numerous visitors. CSW introduced self and reason for the visit.  [name] reported being agreeable to visit at a time without visitors.   CSW will make subsequent attempt on 6/24.  CSW will request that visitors leave her room in the morning in order to complete the assessment prior to discharge.  RN reported that [name] continues to be doing "well" and denied notable/acute concerns.  

## 2015-02-04 NOTE — Progress Notes (Signed)
Explained to pt. That baby is probably cluster feeding/making up for when he was real sleepy.  Pt. States:" He is using me as a pacifier,  He wants me to hold him, you guys need to bring back the pacifiers, this is stressful."  Encourage pt. To feed baby.  Baby placed in crib by pt.

## 2015-02-04 NOTE — Progress Notes (Addendum)
Subjective: Postpartum Day 1: Vaginal delivery, left periurethral laceration Patient up ad lib, reports no syncope or dizziness. Feeding:  Breast Contraceptive plan:  Declines need.  Declined circumcision.  "Bothered" by lack of hospital-provided pacifiers and by 24 hour rooming in.  I reviewed rationales for hospital policies--patient understands.  Objective: Vital signs in last 24 hours: Temp:  [97.9 F (36.6 C)-98.5 F (36.9 C)] 98.5 F (36.9 C) (06/23 0625) Pulse Rate:  [74-87] 87 (06/23 0625) Resp:  [18-19] 19 (06/23 0625) BP: (138-143)/(84-86) 138/84 mmHg (06/22 1410) SpO2:  [100 %] 100 % (06/23 0625)  Physical Exam:  General: alert Lochia: appropriate Uterine Fundus: firm Perineum: healing well DVT Evaluation: No evidence of DVT seen on physical exam. Negative Homan's sign.    Recent Labs  02/03/15 0145  HGB 11.0*  HCT 32.6*   Today's CBC results are delayed in importing into chart due to internet issues.   Assessment/Plan: Status post vaginal delivery day 1. Stable Continue current care. Plan for discharge tomorrow F/U on CBC results SW visit today. At d/c, patient will return to [facility], a residence for mothers and infants.   Nyra Capes 02/04/2015, 9:11 AM

## 2015-02-05 MED ORDER — FERROUS SULFATE 325 (65 FE) MG PO TABS: 325.0000 mg | ORAL_TABLET | Freq: Two times a day (BID) | ORAL | Status: DC

## 2015-02-05 MED ORDER — IBUPROFEN 600 MG PO TABS: 600.0000 mg | ORAL_TABLET | Freq: Four times a day (QID) | ORAL | Status: DC

## 2015-02-05 MED ORDER — OXYCODONE-ACETAMINOPHEN 5-325 MG PO TABS: 1.0000 | ORAL_TABLET | ORAL | Status: DC | PRN

## 2015-02-05 NOTE — Discharge Summary (Signed)
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 be harmful to your baby can pass through breast milk. It is important to ask your health care provider before taking any medicine, including all over-the-counter and prescription medicine as well as vitamin and herbal supplements. It is possible to become pregnant while breastfeeding. If birth control is desired, ask your health care provider about options that will be safe for your baby. SEEK MEDICAL CARE IF:   You feel like you want to stop breastfeeding or have become frustrated with breastfeeding.  You have painful breasts or nipples.  Your nipples are cracked or bleeding.  Your breasts are red, tender, or warm.  You have a swollen area on either breast.  You have a fever or chills.  You have nausea or vomiting.  You have drainage other than breast milk from  your nipples.  Your breasts do not become full before feedings by the fifth day after you give birth.  You feel sad and depressed.  Your baby is too sleepy to eat well.  Your baby is having trouble sleeping.   Your baby is wetting less than 3 diapers in a 24-hour period.  Your baby has less than 3 stools in a 24-hour period.  Your baby's skin or the white part of his or her eyes becomes yellow.   Your baby is not gaining weight by 31 days of age. SEEK IMMEDIATE MEDICAL CARE IF:   Your baby is overly tired (lethargic) and does not want to wake up and feed.  Your baby develops an unexplained fever. Document Released: 07/31/2005 Document Revised: 08/05/2013 Document Reviewed: 01/22/2013 [facility] Patient Information 2015 [place], [place]. This information is not intended to replace advice given to you by your health care provider. Make sure you discuss any questions you have with your health care provider.

## 2015-02-05 NOTE — Plan of Care (Signed)
Problem: Discharge Progression Outcomes Goal: Barriers To Progression Addressed/Resolved Outcome: Completed/Met Date Met:  02/05/15 Social worker in to talk with patient about living situation after discharge

## 2015-02-05 NOTE — Lactation Note (Signed)
This note was copied from the chart of Alicia Tanea Higley. Lactation Consultation Note  Talked to mom briefly about expressing her milk at home.  She does not have or desire an electric pump.  She provided BM for her 26 year old for 2 years.  Aware of outpatient services.  Patient Name: Alicia Morgan [id number] Date: 02/05/2015     Maternal Data    Feeding    LATCH Score/Interventions                      Lactation Tools Discussed/Used     Consult Status      [name] 02/05/2015, 11:32 AM
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
## 2015-02-11 ENCOUNTER — Inpatient Hospital Stay (HOSPITAL_COMMUNITY): Admission: RE | Admit: 2015-02-11 | Payer: Medicaid Other | Source: Ambulatory Visit

## 2015-02-17 ENCOUNTER — Ambulatory Visit (INDEPENDENT_AMBULATORY_CARE_PROVIDER_SITE_OTHER): Payer: Medicaid Other | Admitting: Podiatry

## 2015-02-17 ENCOUNTER — Encounter: Payer: Self-pay | Admitting: Podiatry

## 2015-02-17 VITALS — BP 91/60 | HR 61 | Resp 14 | Ht 68.75 in | Wt 308.0 lb

## 2015-02-17 DIAGNOSIS — B351 Tinea unguium: Secondary | ICD-10-CM | POA: Diagnosis not present

## 2015-02-17 DIAGNOSIS — M79672 Pain in left foot: Secondary | ICD-10-CM | POA: Diagnosis not present

## 2015-02-17 DIAGNOSIS — B353 Tinea pedis: Secondary | ICD-10-CM

## 2015-02-17 NOTE — Progress Notes (Signed)
   Subjective:    Patient ID: Alicia Morgan, female    DOB: October 24, 1990, 24 y.o.   MRN: [id number]  HPI Comments: N rash L B/L arches and toes D years O worsened in the last 3 months C blisters, peeling and itchy, burning rash A T OTC athletic cream and topical polish  Pt complains of thickened and darkening toenails in the last years, using OTC fungal topical polish.     Review of Systems  Genitourinary:       Pt is 1.5 week postpartum.  All other systems reviewed and are negative.      Objective:   Physical Exam  Orientated 3  Vascular: Nonpitting edema ankles bilaterally DP and PT pulses 2/4 bilaterally Capillary reflex immediate bilaterally  Neurological: Sensation to 10 g monofilament wire intact 5/5 bilaterally Vibratory sensation intact bilaterally Ankle reflex equal and reactive bilaterally  Dermatological: The toenails are discolored, elongated, incurvated with subungual debris 1-5 Plantar scaling mid arch bilaterally  Musculoskeletal: Rectus foot type noted bilaterally No deformities noted bilaterally      Assessment & Plan:   Assessment: Satisfactory neurovascular status Mycotic toenails 6-10 Tinea pedis bilaterally  Plan: At this time reviewed the results examination today and aspiration if she is willing to consider oral medication for possible mycotic toenails. She said she would consider oral medication  Nail fragments obtained from the left hallux and submitted for PAS and fungal culture  Notify patient upon receipt of lab

## 2015-02-17 NOTE — Patient Instructions (Signed)
The nail fragments will be submitted to the lab for PAS and fungal culture and response sign could be 30+ days Our office will contact you with results of the lab

## 2015-03-08 ENCOUNTER — Telehealth: Payer: Self-pay | Admitting: *Deleted

## 2015-03-08 NOTE — Telephone Encounter (Signed)
Pt request fungal toenail results.  Left message informing pt the results would take 4-6 weeks to return and we would call with results.

## 2015-03-15 ENCOUNTER — Telehealth: Payer: Self-pay | Admitting: *Deleted

## 2015-03-15 NOTE — Telephone Encounter (Signed)
Informed pt the fungal culture was negative for fungus and the topical 47% Urea gel was not covered by her insurance.  Pt states she is still having problems with the athletes feet, and would like a medication for that.

## 2015-03-16 ENCOUNTER — Telehealth: Payer: Self-pay | Admitting: Podiatry

## 2015-03-16 NOTE — Telephone Encounter (Signed)
Pt called stating she was waiting on a call back from you yesterday and I explained that Dr Leeanne Deed was not in the office yesterday and you were probably waiting on a reply from him. Pt states she does not have the money to waste and asked if there was a pill that could be prescribed to take care of her problem that medicaid would cover. Please call pt back

## 2015-03-17 ENCOUNTER — Other Ambulatory Visit: Payer: Self-pay

## 2015-03-18 NOTE — Telephone Encounter (Signed)
I informed pt the prescription topicals for athletes foot are not covered by her Medicaid, and Dr. Leeanne Deed recommended pt to use Lamisil Cream OTC.

## 2015-03-25 ENCOUNTER — Ambulatory Visit (INDEPENDENT_AMBULATORY_CARE_PROVIDER_SITE_OTHER): Payer: Medicaid Other | Admitting: Podiatry

## 2015-03-25 ENCOUNTER — Encounter: Payer: Self-pay | Admitting: Podiatry

## 2015-03-25 DIAGNOSIS — L603 Nail dystrophy: Secondary | ICD-10-CM | POA: Diagnosis not present

## 2015-03-25 DIAGNOSIS — B353 Tinea pedis: Secondary | ICD-10-CM

## 2015-03-25 MED ORDER — TERBINAFINE HCL 250 MG PO TABS: 250.0000 mg | ORAL_TABLET | Freq: Every day | ORAL | Status: DC

## 2015-03-25 NOTE — Progress Notes (Signed)
She presents today with a chief complaint of painful elongated toenails as well as thick dystrophic nails that bother her on ambulation. She also states that she has a rash in the plantar aspect of the bilateral foot there's been with her for many years now and she would like to get rid of this. She denies any changes in her past mental history medications allergies surgery social history she did give birth recently and is no longer breast-feeding.  Objective: 24 year old black female presents vital signs are stable she's alert and oriented 3. Pulses are strongly palpable bilateral. Neurologic sensorium is intact per Semmes-Weinstein monofilament. Deep tendon reflexes are intact bilateral muscle strength +5 over 5 dorsiflexors plantar flexors and inverters everters all digits musculature is intact. Orthopedic evaluation does demonstrates pes planus bilateral mild possible hammertoe deformities bilateral are asymptomatic. Cutaneous evaluation does demonstrate supple well-hydrated uterus to the dorsal aspect of the foot with moccasin type distribution of tinea pedis to the bilateral foot toenails are thick and discolored. I reviewed previous liver profile as well as a sample of her skin and nail pathology report from [facility] labs which does demonstrate negative onychomycosis.  Assessment: 24 year old female with nail dystrophy still concerned of onychomycosis. Probable tinea pedis and cannot rule out an eczematous dermatitis.  Plan: Discussed etiology pathology conservative versus surgical therapies. At this point I feel we possibly have a false negative based on the evaluation of her toenails and skin today. I started her on Lamisil 250 mg tablets 1 by mouth daily for the next 2 weeks. I will follow-up with her at that time to reassess our treatment plan and to reevaluate her feet. Should this resolve her tinea pedis, I would suggest we start her on a full dose of Lamisil and perform another liver profile.

## 2015-03-25 NOTE — Patient Instructions (Signed)

## 2015-04-17 ENCOUNTER — Emergency Department (HOSPITAL_COMMUNITY)
Admission: EM | Admit: 2015-04-17 | Discharge: 2015-04-17 | Disposition: A | Discharge status: Home / Self Care | Payer: Medicaid Other | Attending: Emergency Medicine | Admitting: Emergency Medicine

## 2015-04-17 ENCOUNTER — Encounter (HOSPITAL_COMMUNITY): Payer: Self-pay | Admitting: Emergency Medicine

## 2015-04-17 DIAGNOSIS — Z79899 Other long term (current) drug therapy: Secondary | ICD-10-CM | POA: Diagnosis not present

## 2015-04-17 DIAGNOSIS — Z8619 Personal history of other infectious and parasitic diseases: Secondary | ICD-10-CM | POA: Insufficient documentation

## 2015-04-17 DIAGNOSIS — D649 Anemia, unspecified: Secondary | ICD-10-CM | POA: Diagnosis not present

## 2015-04-17 DIAGNOSIS — Z91040 Latex allergy status: Secondary | ICD-10-CM | POA: Insufficient documentation

## 2015-04-17 DIAGNOSIS — F419 Anxiety disorder, unspecified: Secondary | ICD-10-CM | POA: Diagnosis not present

## 2015-04-17 DIAGNOSIS — Z87891 Personal history of nicotine dependence: Secondary | ICD-10-CM | POA: Insufficient documentation

## 2015-04-17 DIAGNOSIS — F329 Major depressive disorder, single episode, unspecified: Secondary | ICD-10-CM | POA: Insufficient documentation

## 2015-04-17 DIAGNOSIS — E669 Obesity, unspecified: Secondary | ICD-10-CM | POA: Diagnosis not present

## 2015-04-17 NOTE — Discharge Instructions (Signed)
It is important for you to follow-up with your doctor or psychiatrist for further evaluation and management of your anxiety. You may also use a resource guide attached to help find another psychiatrist or primary care doctor   Emergency Department Resource Guide 1) Find a Doctor and Pay Out of Pocket Although you won't have to find out who is covered by your insurance plan, it is a good idea to ask around and get recommendations. You will then need to call the office and see if the doctor you have chosen will accept you as a new patient and what types of options they offer for patients who are self-pay. Some doctors offer discounts or will set up payment plans for their patients who do not have insurance, but you will need to ask so you aren't surprised when you get to your appointment.  2) Contact Your Local Health Department Not all health departments have doctors that can see patients for sick visits, but many do, so it is worth a call to see if yours does. If you don't know where your local health department is, you can check in your phone book. The CDC also has a tool to help you locate your state's health department, and many state websites also have listings of all of their local health departments.  3) Find a Walk-in Clinic If your illness is not likely to be very severe or complicated, you may want to try a walk in clinic. These are popping up all over the country in pharmacies, drugstores, and shopping centers. They're usually staffed by nurse practitioners or physician assistants that have been trained to treat common illnesses and complaints. They're usually fairly quick and inexpensive. However, if you have serious medical issues or chronic medical problems, these are probably not your best option.  No Primary Care Doctor: - Call Health Connect at  [phone] - they can help you locate a primary care doctor that  accepts your insurance, provides certain services, etc. - Physician Referral  Service- [phone]  Chronic Pain Problems: Organization         Address  Phone   Notes  [facility]  [phone] Patients need to be referred by their primary care doctor.   Medication Assistance: Organization         Address  Phone   Notes  [facility] Medication [facility] [address] [place] [place], [place] [address] [phone] --Must be a resident of [facility] -- Must have NO insurance coverage whatsoever (no Medicaid/ Medicare, etc.) -- The pt. MUST have a primary care doctor that directs their care regularly and follows them in the community   MedAssist  [phone]   [employer]  [phone]    Agencies that provide inexpensive medical care: Organization         Address  Phone   Notes  [facility]  [phone]   [facility]    [phone]   [facility] [address] [place], [place] [address] [phone]   [facility] [address] [place], [place] [phone]   Planned Parenthood    [phone]   [facility]    [phone]   [facility]  [address], [place] Phone:  [phone], Fax:  [phone] Hours of Operation:  9 am - 6 pm, M-F.  Also accepts Medicaid/Medicare and self-pay.  [facility]  [place] for [place], [address], [place] Phone: [phone], Fax: [phone]. Hours of Operation:  8:30 am - 5:30 pm, M-F.  Also accepts Medicaid and self-pay.  [facility] [place] [address] [place] Phone: [phone]   [place], [place], [place] [phone], Ext. [phone] Mondays & Thursdays: 7-9 AM.  First 15 patients are seen on a first come, first serve basis.    [place] Providers:  Organization         Address  Phone   Notes  [facility] [address], [place] [phone] Also accepts self-pay patients.  [facility] [address], [place]  [phone]   [place], [address], [place] [phone]   [facility] [address] [place] [phone]   Lucianne Lei [address], [place]   [phone] Only accepts [place] Access [place] patients after they have their name applied to their card.   Self-Pay (no insurance) in [facility]:  Organization         Address  Phone   Notes  Sickle Cell Patients, [facility] [place] [phone]   [facility]gent Care [place] [phone]   [facility] [place]  [place], [place], [place] [phone]   [facility]/Dr. Osei-Bonsu  [address], [place] [address], [place] [phone] Phone number for both [place] and [place] locations is the same.  Urgent Medical and [facility] [address] [place] [phone]   [facility] [address] [place] or [address] [phone]   [facility] [address], [place] [phone], phone; [phone], fax Sees patients 1st and 3rd Saturday of every month.  Must not qualify for public or private insurance (i.e. Medicaid, Medicare, [place] Health Choice, Veterans' Benefits)  Household income should be no more than 200% of the poverty level The clinic cannot treat you if you are pregnant or think you are pregnant  Sexually transmitted diseases are not treated at the clinic.    Dental Care: Organization         Address  Phone  Notes  [facility] Department of [facility] [place] [phone] Accepts children up to age 23 who are enrolled in  [place] or [place]; pregnant women with a Medicaid card; and children who have applied for Medicaid or [place] Health Choice, but were declined, whose parents can pay a reduced fee at time of service.  [facility] Department of [facility]  [address], [place] [phone] Accepts children up to age 30 who are enrolled in [place] or [place]; pregnant women with a Medicaid card; and children who have applied for Medicaid or [place] Health Choice, but were declined, whose parents can pay a reduced fee at time of service.  [facility]  [place] [phone] Patients are seen by appointment only. Walk-ins are not accepted. [place] will see patients 70 years of age and older. Monday - Tuesday (8am-5pm) Most Wednesdays (8:30-5pm) $30 per visit,  cash only  [employer] Adult [employer] PROGRAM  [address], [place] [phone] Patients are seen by appointment only. Walk-ins are not accepted. [place] will see patients 68 years of age and older. One Wednesday Evening (Monthly: Volunteer Based).  $30 per visit, cash only  [place]  [phone] for adults; Children under age 32, call Graduate Pediatric Dentistry at [phone]. Children aged 36-14, please call [phone] to request a pediatric application.  Dental services are provided in all areas of dental care including fillings, crowns and bridges, complete and partial dentures, implants, gum treatment, root canals, and extractions. Preventive care is also provided. Treatment is provided to both adults and children. Patients are selected via a lottery and there is often a waiting list.   [facility] [address] [place]  [phone] [email or web address]   [facility] [address] [place], [place] [phone], Ext. [phone] Second and Fourth Thursday of each month, opens at 6:30  AM; Clinic ends at 9 AM.  Patients are seen on a first-come first-served basis, and a limited number are seen during each clinic.   [facility]  [address] [place]ard Danker [place], [place] [phone]   Eligibility Requirements You must have lived in [place], [place], or [place] counties for at least the last three months.   You cannot be eligible for state or federal sponsored [employer], including [employer], [place], or [employer].   You generally cannot be eligible for healthcare insurance through your employer.    How to apply: Eligibility screenings are held every Tuesday and Wednesday afternoon from 1:00 pm until 4:00 pm. You do not need an appointment for the interview!  [facility] [address] [place], [place]   [place]  [place] Department  [place]  [phone]    Behavioral Health Resources in the Community: Intensive Outpatient Programs Organization         Address  Phone  Notes  Bejou Flanagan. [address], [place], [place] [phone]   [facility] Outpatient [address] [place], [place]   ADS: Alcohol & Drug Svcs [address] [place], [place]   [place] [address],  [place], [place] or [phone]   Substance Abuse Resources Organization         Address  Phone  Notes  Alcohol and Drug Services  [phone]   [place]  [phone]   [facility] [place]   Chinita Pester  [phone]   Residential & Outpatient Substance Abuse Program  [phone]   Psychological Services Organization         Address  Phone  Notes  [facility] [place]  [place]  [phone]   [place] [address] [place] or  [phone]    Mobile Crisis Teams Organization         Address  Phone  Notes  [facility], Mobile Crisis Care Unit  [phone]   [facility]  [address] [place], [place]   [place] [address], [place] [phone]    Self-Help/Support Groups Organization         Address  Phone  Notes  [facility] of [place] - variety of support groups  [place] Call for more information  Narcotics Anonymous (NA), [facility]aring [facility] [address], [employer] [place]  2 meetings at this location   Special educational needs teacher         Address  Phone  Notes  [facility]AP Residential Treatment [place],    [place]  [phone]   [facility]  [address] [place] [phone], [place], [place]   [place], [place] [phone] Admissions: 8am-3pm M-F  Incentives Substance [place] [address]    [place], [place] [phone]   [facility] [address] [place], [place], [place]   [facility] [address],  [place], [place]   [facility] - Intensive Outpatient [address] Kristeen Mans [address], [place], [place]   [facility] [place].,  [place], [place] [phone] or [phone]   [facility] Services (RTS) [address] [place], [place] Accepts Medicaid  [facility] [place] [address],  [place] [phone] Substance Abuse/Addiction Treatment   [facility] Organization         Address  Phone  Notes  [facility]  [phone]   Domenic Schwab, PhD [address] Arlis Porta [place], [place]   [phone] or [phone]   [place] and [place], [place] [phone]   [facility] [address],  [place], [place] [phone] Insurance/Medicaid/sponsorship through [facility] and Families [address], [address] [place]                                    [place], [place] [phone] [place] [address] [place], [place] [phone]    Dr. Adele Schilder  [phone]   [facility] Dept. 1) [address], [place] 2) [place] 3)  [place] [address], [place] [phone]  [phone]   [place] [phone] or [phone] (After Hours)

## 2015-04-17 NOTE — ED Provider Notes (Signed)
CSN: [id number]     Arrival date & time 04/17/15  1011 History   First MD Initiated Contact with Patient 04/17/15 1023     Chief Complaint  Patient presents with  . Anxiety     (Consider location/radiation/quality/duration/timing/severity/associated sxs/prior Treatment) HPI Alicia Morgan is a 24 y.o. female History of anxiety, depression Comes in for evaluation of anxiety. Patient states she has been to multiple therapists but has been unable to find relief of her anxiety Patient states she has been titrated on medications including trazodone, Hydroxyzine and Zoloft, but these medications are not working for her anxiety.She reports she previously has taken Xanax with some relief of her symptoms. She reports her OB doctor referred her to the ED so she may be prescribed a short course of Xanax. She reports she cannot take her hydroxyzine because it makes her heart rate too fast. She denies any homicidal or suicidal ideations. No alcohol or illicit drug use. No auditory or visual hallucinations. No other medical complaints at this time. No fevers, chills, chest pain, shortness of breath or abdominal pain, numbness or weakness.  Past Medical History  Diagnosis Date  . Anemia   . Depression   . Headache   . Obesity   . Chlamydia   . Gestational hypertension 02/03/2015   Past Surgical History  Procedure Laterality Date  . Open reduction and internal fixation of ankle fracture.    . Fracture surgery      L tibia    No family history on file. Social History  Substance Use Topics  . Smoking status: Former Smoker    Types: Cigars    Quit date: 07/14/2014  . Smokeless tobacco: Never Used  . Alcohol Use: No     Comment: not while pregnant    OB History    Gravida Para Term Preterm AB TAB SAB Ectopic Multiple Living   3 2 2  1 1    0 1     Review of Systems A 10 point review of systems was completed and was negative except for pertinent positives and negatives as mentioned in the  history of present illness   Allergies  Latex  Home Medications   Prior to Admission medications   Medication Sig Start Date End Date Taking? Authorizing Provider  cetirizine (ZYRTEC) 10 MG tablet Take 10 mg by mouth daily as needed for allergies.    Historical Provider, MD  ferrous sulfate 325 (65 FE) MG tablet Take 1 tablet (325 mg total) by mouth 2 (two) times daily with a meal. 02/05/15   Venus Standard, CNM  ibuprofen (ADVIL,MOTRIN) 600 MG tablet Take 1 tablet (600 mg total) by mouth every 6 (six) hours. 02/05/15   Venus Standard, CNM  oxyCODONE-acetaminophen (PERCOCET/ROXICET) 5-325 MG per tablet Take 1 tablet by mouth every 4 (four) hours as needed (for pain scale 4-7). 02/05/15   Venus Standard, CNM  Prenatal Vit-Fe Fumarate-FA (PRENATAL COMPLETE) 14-0.4 MG TABS Take 1 tablet by mouth daily. 06/18/14   Garlon Hatchet, PA-C  sertraline (ZOLOFT) 25 MG tablet Take 25 mg by mouth daily.    Historical Provider, MD  terbinafine (LAMISIL) 250 MG tablet Take 1 tablet (250 mg total) by mouth daily. 03/25/15   Max T Hyatt, DPM   BP 124/103 mmHg  Pulse 85  Temp(Src) 99 F (37.2 C) (Oral)  Resp 18  SpO2 100% Physical Exam  Constitutional:  Awake, alert, nontoxic appearance.  HENT:  Head: Atraumatic.  Eyes: Right eye exhibits no discharge. Left  eye exhibits no discharge.  Neck: Neck supple.  Cardiovascular: Normal rate, regular rhythm and normal heart sounds.   Pulmonary/Chest: Effort normal. She exhibits no tenderness.  Abdominal: Soft. There is no tenderness. There is no rebound.  Musculoskeletal: She exhibits no tenderness.  Baseline ROM, no obvious new focal weakness.  Neurological:  Mental status and motor strength appears baseline for patient and situation.  Skin: No rash noted.  Psychiatric: She has a normal mood and affect.  Patient is GCS 15, normal and appropriate mood and affect. Answers questions appropriately.  Nursing note and vitals reviewed.   ED Course  Procedures  (including critical care time) Labs Review Labs Reviewed - No data to display  Imaging Review No results found. I have personally reviewed and evaluated these images and lab results as part of my medical decision-making.   EKG Interpretation None     Filed Vitals:   04/17/15 1022  BP: 124/103  Pulse: 85  Temp: 99 F (37.2 C)  Resp: 18    MDM  Vitals stable afebrile Pt resting comfortably in ED. PE--Physical exam as above and is not concerning.  DDX--Patient here for evaluation of ongoing anxiety and depression.Patient has been prescribed for hydroxyzine at home, but states this is not working and prefers to use Xanax. Patient reports she has been unable tofind a provider that is able to provide this for her. Discussed we will not be doing this in the ED. Given resource guide for outpatient services. Attempted social work consult, but was unable to reach Child psychotherapist on call. Patient denies any suicidal or homicidal ideations. No auditory or visual hallucinations.No alcohol or illicit drug use.Patient stable and appropriate for discharge.   I discussed all relevant lab findings and imaging results with pt and they verbalized understanding. Discussed f/u with PCP within 48 hrs and return precautions, pt very amenable to plan.  Final diagnoses:  Anxiety       Joycie Peek, PA-C 04/17/15 1108  Glynn Octave, MD 04/17/15 1537

## 2015-04-17 NOTE — ED Notes (Signed)
Per patient states increased anxiety due to recent mother's death-here for script

## 2015-04-22 ENCOUNTER — Encounter (HOSPITAL_COMMUNITY): Payer: Self-pay | Admitting: *Deleted

## 2015-04-22 ENCOUNTER — Encounter: Payer: Self-pay | Admitting: Podiatry

## 2015-04-22 ENCOUNTER — Ambulatory Visit (INDEPENDENT_AMBULATORY_CARE_PROVIDER_SITE_OTHER): Payer: Medicaid Other | Admitting: Podiatry

## 2015-04-22 ENCOUNTER — Emergency Department (INDEPENDENT_AMBULATORY_CARE_PROVIDER_SITE_OTHER)
Admission: EM | Admit: 2015-04-22 | Discharge: 2015-04-22 | Disposition: A | Discharge status: Home / Self Care | Payer: Medicaid Other | Source: Home / Self Care | Attending: Family Medicine | Admitting: Family Medicine

## 2015-04-22 ENCOUNTER — Encounter: Payer: Medicaid Other | Admitting: Podiatry

## 2015-04-22 VITALS — BP 137/78 | HR 87 | Resp 12

## 2015-04-22 DIAGNOSIS — Z79899 Other long term (current) drug therapy: Secondary | ICD-10-CM | POA: Diagnosis not present

## 2015-04-22 DIAGNOSIS — R002 Palpitations: Secondary | ICD-10-CM | POA: Diagnosis not present

## 2015-04-22 DIAGNOSIS — B351 Tinea unguium: Secondary | ICD-10-CM | POA: Diagnosis not present

## 2015-04-22 DIAGNOSIS — B353 Tinea pedis: Secondary | ICD-10-CM | POA: Diagnosis not present

## 2015-04-22 MED ORDER — TERBINAFINE HCL 250 MG PO TABS: 250.0000 mg | ORAL_TABLET | Freq: Every day | ORAL | Status: DC

## 2015-04-22 NOTE — ED Notes (Signed)
Pt  Reports  Symptoms  Of tachycardia      And tightness in  Chest  X  6  Weeks      She was  Seen  [facility]  [facility]  Days  Ago         She  Is  Sitting  Upright on the  Exam table  Speaking in  Complete  sentances

## 2015-04-22 NOTE — ED Provider Notes (Signed)
CSN: [id number]     Arrival date & time 04/22/15  1311 History   First MD Initiated Contact with Patient 04/22/15 1331     Chief Complaint  Patient presents with  . Tachycardia   (Consider location/radiation/quality/duration/timing/severity/associated sxs/prior Treatment) Patient is a 24 y.o. female presenting with palpitations. The history is provided by the patient.  Palpitations Palpitations quality:  Fast Onset quality:  Gradual Progression:  Waxing and waning Chronicity:  Recurrent Context: anxiety   Context comment:  Pt relates sx to mood swings, agitation, anger, stress etc. has seen sev health care providers and told to get a primary md, has stopped her meds b/o side effects. Relieved by:  Nothing Worsened by:  Stress Associated symptoms: no chest pain and no shortness of breath   Risk factors comment:  Recent baby.   Past Medical History  Diagnosis Date  . Anemia   . Depression   . Headache   . Obesity   . Chlamydia   . Gestational hypertension 02/03/2015   Past Surgical History  Procedure Laterality Date  . Open reduction and internal fixation of ankle fracture.    . Fracture surgery      L tibia    History reviewed. No pertinent family history. Social History  Substance Use Topics  . Smoking status: Former Smoker    Types: Cigars    Quit date: 07/14/2014  . Smokeless tobacco: Never Used  . Alcohol Use: No     Comment: not while pregnant    OB History    Gravida Para Term Preterm AB TAB SAB Ectopic Multiple Living   0 1     Review of Systems  Respiratory: Negative for chest tightness, shortness of breath and wheezing.   Cardiovascular: Positive for palpitations. Negative for chest pain and leg swelling.  All other systems reviewed and are negative.   Allergies  Latex  Home Medications   Prior to Admission medications   Medication Sig Start Date End Date Taking? Authorizing Provider  cetirizine (ZYRTEC) 10 MG tablet Take 10 mg by  mouth daily as needed for allergies.    Historical Provider, MD  ferrous sulfate 325 (65 FE) MG tablet Take 1 tablet (325 mg total) by mouth 2 (two) times daily with a meal. 02/05/15   Venus Standard, CNM  ibuprofen (ADVIL,MOTRIN) 600 MG tablet Take 1 tablet (600 mg total) by mouth every 6 (six) hours. 02/05/15   Venus Standard, CNM  oxyCODONE-acetaminophen (PERCOCET/ROXICET) 5-325 MG per tablet Take 1 tablet by mouth every 4 (four) hours as needed (for pain scale 4-7). 02/05/15   Venus Standard, CNM  Prenatal Vit-Fe Fumarate-FA (PRENATAL COMPLETE) 14-0.4 MG TABS Take 1 tablet by mouth daily. 06/18/14   Garlon Hatchet, PA-C  sertraline (ZOLOFT) 25 MG tablet Take 25 mg by mouth daily.    Historical Provider, MD  terbinafine (LAMISIL) 250 MG tablet Take 1 tablet (250 mg total) by mouth daily. 04/22/15   Max T Al Corpus, DPM   Meds Ordered and Administered this Visit  Medications - No data to display  Pulse 86  Temp(Src) 98.3 F (36.8 C) (Oral)  Resp 18  SpO2 98%  LMP 03/30/2015 No data found.   Physical Exam  Constitutional: She is oriented to person, place, and time. She appears well-developed and well-nourished.  Neck: Normal range of motion. Neck supple. No thyromegaly present.  Cardiovascular: Normal rate, regular rhythm, normal heart sounds and intact distal pulses.   Pulmonary/Chest: Breath  sounds normal. No respiratory distress. She exhibits no tenderness.  Neurological: She is alert and oriented to person, place, and time.  Skin: Skin is warm and dry.  Nursing note and vitals reviewed.   ED Course  Procedures (including critical care time)  Labs Review Labs Reviewed - No data to display  Imaging Review No results found.   Visual Acuity Review  Right Eye Distance:   Left Eye Distance:   Bilateral Distance:    Right Eye Near:   Left Eye Near:    Bilateral Near:         MDM   1. Rapid palpitations        Linna Hoff, MD 04/30/15 2002

## 2015-04-22 NOTE — Progress Notes (Signed)
She presents today for follow-up of her tinea pedis and onychomycosis. We performed a trial. Of Lamisil for her. At this point she states that her feet are doing much better she still has some dry scaliness to the posterior heels but she says all in all she is doing much better.  Objective: vital signs are stable she is alert and oriented 3. Pulses are intact. Plantar aspect of the bilateral foot illustrates near 100% resolution of tinea pedis. No interdigital tinea pedis remaining. Her toenails are still thick yellow dystrophic and probably mycotic.   Assessment: pain limb secondary to onychomycosis and tinea pedis bilateral.  Plan: we are requesting a liver profile today and CBC. We started her on 30 days of Lamisil 250 mg tablets 1 by mouth daily. Follow-up with her in 1 month for another liver profile. Should her blood work him back abnormal either time we will notify her immediately.

## 2015-04-22 NOTE — Discharge Instructions (Signed)
See your doctor for further eval as needed of medical issues.

## 2015-05-04 ENCOUNTER — Emergency Department (HOSPITAL_COMMUNITY)
Admission: EM | Admit: 2015-05-04 | Discharge: 2015-05-04 | Disposition: A | Discharge status: Home / Self Care | Payer: Medicaid Other | Attending: Physician Assistant | Admitting: Physician Assistant

## 2015-05-04 ENCOUNTER — Encounter (HOSPITAL_COMMUNITY): Payer: Self-pay

## 2015-05-04 DIAGNOSIS — E669 Obesity, unspecified: Secondary | ICD-10-CM | POA: Insufficient documentation

## 2015-05-04 DIAGNOSIS — R0981 Nasal congestion: Secondary | ICD-10-CM | POA: Insufficient documentation

## 2015-05-04 DIAGNOSIS — Z8659 Personal history of other mental and behavioral disorders: Secondary | ICD-10-CM | POA: Diagnosis not present

## 2015-05-04 DIAGNOSIS — Z91040 Latex allergy status: Secondary | ICD-10-CM | POA: Diagnosis not present

## 2015-05-04 DIAGNOSIS — Z87891 Personal history of nicotine dependence: Secondary | ICD-10-CM | POA: Diagnosis not present

## 2015-05-04 DIAGNOSIS — Z8619 Personal history of other infectious and parasitic diseases: Secondary | ICD-10-CM | POA: Diagnosis not present

## 2015-05-04 DIAGNOSIS — J3489 Other specified disorders of nose and nasal sinuses: Secondary | ICD-10-CM | POA: Insufficient documentation

## 2015-05-04 DIAGNOSIS — R519 Headache, unspecified: Secondary | ICD-10-CM

## 2015-05-04 DIAGNOSIS — R51 Headache: Secondary | ICD-10-CM | POA: Diagnosis present

## 2015-05-04 DIAGNOSIS — Z3202 Encounter for pregnancy test, result negative: Secondary | ICD-10-CM | POA: Diagnosis not present

## 2015-05-04 DIAGNOSIS — R040 Epistaxis: Secondary | ICD-10-CM | POA: Insufficient documentation

## 2015-05-04 LAB — HCG, SERUM, QUALITATIVE: PREG SERUM: NEGATIVE

## 2015-05-04 MED ORDER — DIPHENHYDRAMINE HCL 50 MG/ML IJ SOLN
25.0000 mg | Freq: Once | INTRAMUSCULAR | Status: AC
Start: 2015-05-04 — End: 2015-05-04
  Administered 2015-05-04: 25 mg via INTRAVENOUS
  Filled 2015-05-04: qty 1

## 2015-05-04 MED ORDER — PROCHLORPERAZINE EDISYLATE 5 MG/ML IJ SOLN
5.0000 mg | Freq: Once | INTRAMUSCULAR | Status: AC
  Administered 2015-05-04: 5 mg via INTRAVENOUS
  Filled 2015-05-04: qty 2

## 2015-05-04 MED ORDER — KETOROLAC TROMETHAMINE 30 MG/ML IJ SOLN
30.0000 mg | Freq: Once | INTRAMUSCULAR | Status: AC
  Administered 2015-05-04: 30 mg via INTRAVENOUS
  Filled 2015-05-04: qty 1

## 2015-05-04 MED ORDER — SODIUM CHLORIDE 0.9 % IV BOLUS (SEPSIS)
1000.0000 mL | Freq: Once | INTRAVENOUS | Status: AC
  Administered 2015-05-04: 1000 mL via INTRAVENOUS

## 2015-05-04 NOTE — ED Notes (Signed)
Pt escorted to discharge window. Pt verbalized understanding discharge instructions. In no acute distress.  

## 2015-05-04 NOTE — ED Notes (Signed)
Patient did not wanted to want for IV start. So I could not get blood.

## 2015-05-04 NOTE — Discharge Instructions (Signed)
Take IBuprofen or decongestants from your local pharmacy to help with your symptoms.

## 2015-05-04 NOTE — ED Provider Notes (Signed)
CSN: [id number]     Arrival date & time 05/04/15  4098 History   First MD Initiated Contact with Patient 05/04/15 [phone]     Chief Complaint  Patient presents with  . Headache  . Head Congestion      (Consider location/radiation/quality/duration/timing/severity/associated sxs/prior Treatment) HPI   Patient is a 24 year old female presenting with congestion and headache. Patient states he thinks it's "a migraine". Patient has had 1 migraine in the past 4 years ago. Patient has a 14-month-old child was with her in the emergency department this time. She is requesting IV because she thinks will make her feel better.  Headache associated with blowing her nose, congestion and feeling fatigued. It started on Friday with a sore throat.  Past Medical History  Diagnosis Date  . Anemia   . Depression   . Headache   . Obesity   . Chlamydia   . Gestational hypertension 02/03/2015   Past Surgical History  Procedure Laterality Date  . Open reduction and internal fixation of ankle fracture.    . Fracture surgery      L tibia    History reviewed. No pertinent family history. Social History  Substance Use Topics  . Smoking status: Former Smoker    Types: Cigars    Quit date: 07/14/2014  . Smokeless tobacco: Never Used  . Alcohol Use: No     Comment: not while pregnant    OB History    Gravida Para Term Preterm AB TAB SAB Ectopic Multiple Living   0 1     Review of Systems  Constitutional: Negative for activity change and fatigue.  HENT: Positive for congestion, nosebleeds and rhinorrhea. Negative for drooling.   Eyes: Negative for discharge.  Respiratory: Negative for cough and chest tightness.   Cardiovascular: Negative for chest pain.  Gastrointestinal: Negative for abdominal distention.  Genitourinary: Negative for dysuria and difficulty urinating.  Musculoskeletal: Negative for joint swelling.  Skin: Negative for rash.  Allergic/Immunologic: Negative for  immunocompromised state.  Neurological: Positive for headaches. Negative for seizures and speech difficulty.  Psychiatric/Behavioral: Negative for behavioral problems and agitation.      Allergies  Latex  Home Medications   Prior to Admission medications   Medication Sig Start Date End Date Taking? Authorizing Provider  Phenylephrine-APAP-Guaifenesin (MUCINEX FAST-MAX COLD & SINUS) 10-650-400 MG/20ML LIQD Take 20 mLs by mouth every 12 (twelve) hours as needed (for cold).   Yes Historical Provider, MD  ferrous sulfate 325 (65 FE) MG tablet Take 1 tablet (325 mg total) by mouth 2 (two) times daily with a meal. Patient not taking: Reported on 05/04/2015 02/05/15   Venus Standard, CNM  ibuprofen (ADVIL,MOTRIN) 600 MG tablet Take 1 tablet (600 mg total) by mouth every 6 (six) hours. Patient not taking: Reported on 05/04/2015 02/05/15   Venus Standard, CNM  oxyCODONE-acetaminophen (PERCOCET/ROXICET) 5-325 MG per tablet Take 1 tablet by mouth every 4 (four) hours as needed (for pain scale 4-7). Patient not taking: Reported on 05/04/2015 02/05/15   Venus Standard, CNM  Prenatal Vit-Fe Fumarate-FA (PRENATAL COMPLETE) 14-0.4 MG TABS Take 1 tablet by mouth daily. Patient not taking: Reported on 05/04/2015 06/18/14   Garlon Hatchet, PA-C  terbinafine (LAMISIL) 250 MG tablet Take 1 tablet (250 mg total) by mouth daily. Patient not taking: Reported on 05/04/2015 04/22/15   Max T Hyatt, DPM   BP 146/84 mmHg  Pulse 82  Temp(Src) 97.8 F (36.6 C) (Oral)  Resp 16  SpO2 100%  LMP 04/30/2015 Physical Exam  Constitutional: She is oriented to person, place, and time. She appears well-developed and well-nourished.  HENT:  Head: Normocephalic and atraumatic.  Right Ear: External ear normal.  Left Ear: External ear normal.  Mouth/Throat: Oropharynx is clear and moist. No oropharyngeal exudate.  turbniates erythematous  No papilledema noted  Eyes: Conjunctivae are normal. Right eye exhibits no discharge.   Neck: Neck supple.  Cardiovascular: Normal rate, regular rhythm and normal heart sounds.   No murmur heard. Pulmonary/Chest: Effort normal and breath sounds normal. She has no wheezes. She has no rales.  Abdominal: Soft. She exhibits no distension. There is no tenderness.  Musculoskeletal: Normal range of motion. She exhibits no edema.  Neurological: She is oriented to person, place, and time. No cranial nerve deficit.  Skin: Skin is warm and dry. No rash noted. She is not diaphoretic.  Psychiatric: She has a normal mood and affect. Her behavior is normal.  Nursing note and vitals reviewed.   ED Course  Procedures (including critical care time) Labs Review Labs Reviewed - No data to display  Imaging Review No results found. I have personally reviewed and evaluated these images and lab results as part of my medical decision-making.   EKG Interpretation None      MDM   Final diagnoses:  None    Patient is a 24 year old female presenting today with congestion and headache. She states she thinks it's a migraine and would like an IV to make her feel better. Patient had 1 migraine in the past. To me this sounds like sinus congestion headache. She's had 3 days of runny nose, sore throat. Patient's here with her 50-month-old child . We will give her an IV fluids and ketorolac with Compazine. Plan to discharge her once she is feeling better. Do not suspect subarachnoid hemorrhage given the time course of events. Do not suspect meningitis given lack of year and lack of meningismus.     Courteney Randall An, MD 05/04/15 [phone]

## 2015-05-04 NOTE — ED Notes (Signed)
Pt c/o head/nasal congestion x 4 days, headache and epistaxis x 2 days and blurred vision x 1 day.  Pain score 10/10.  Epistaxis has resolved.  Pt reports taking ibuprofen w/o relief.  Pt is not breast feeding.

## 2015-05-14 NOTE — Progress Notes (Signed)
This encounter was created in error - please disregard.

## 2015-06-22 ENCOUNTER — Emergency Department (HOSPITAL_COMMUNITY)
Admission: EM | Admit: 2015-06-22 | Discharge: 2015-06-22 | Disposition: A | Discharge status: Home / Self Care | Payer: Medicaid Other | Attending: Emergency Medicine | Admitting: Emergency Medicine

## 2015-06-22 ENCOUNTER — Encounter (HOSPITAL_COMMUNITY): Payer: Self-pay | Admitting: Emergency Medicine

## 2015-06-22 DIAGNOSIS — Z79899 Other long term (current) drug therapy: Secondary | ICD-10-CM | POA: Diagnosis not present

## 2015-06-22 DIAGNOSIS — Z87891 Personal history of nicotine dependence: Secondary | ICD-10-CM | POA: Diagnosis not present

## 2015-06-22 DIAGNOSIS — Z8759 Personal history of other complications of pregnancy, childbirth and the puerperium: Secondary | ICD-10-CM | POA: Diagnosis not present

## 2015-06-22 DIAGNOSIS — D649 Anemia, unspecified: Secondary | ICD-10-CM | POA: Diagnosis not present

## 2015-06-22 DIAGNOSIS — E669 Obesity, unspecified: Secondary | ICD-10-CM | POA: Diagnosis not present

## 2015-06-22 DIAGNOSIS — Z8619 Personal history of other infectious and parasitic diseases: Secondary | ICD-10-CM | POA: Insufficient documentation

## 2015-06-22 DIAGNOSIS — Z8659 Personal history of other mental and behavioral disorders: Secondary | ICD-10-CM | POA: Insufficient documentation

## 2015-06-22 DIAGNOSIS — N898 Other specified noninflammatory disorders of vagina: Secondary | ICD-10-CM | POA: Diagnosis present

## 2015-06-22 DIAGNOSIS — Z91040 Latex allergy status: Secondary | ICD-10-CM | POA: Diagnosis not present

## 2015-06-22 DIAGNOSIS — N760 Acute vaginitis: Secondary | ICD-10-CM | POA: Diagnosis not present

## 2015-06-22 DIAGNOSIS — B9689 Other specified bacterial agents as the cause of diseases classified elsewhere: Secondary | ICD-10-CM

## 2015-06-22 LAB — URINALYSIS, ROUTINE W REFLEX MICROSCOPIC
BILIRUBIN URINE: NEGATIVE
GLUCOSE, UA: NEGATIVE mg/dL
Hgb urine dipstick: NEGATIVE
KETONES UR: NEGATIVE mg/dL
LEUKOCYTES UA: NEGATIVE
Nitrite: NEGATIVE
PH: 6 (ref 5.0–8.0)
Protein, ur: NEGATIVE mg/dL
Specific Gravity, Urine: 1.029 (ref 1.005–1.030)
Urobilinogen, UA: 0.2 mg/dL (ref 0.0–1.0)

## 2015-06-22 LAB — WET PREP, GENITAL
Trich, Wet Prep: NONE SEEN
YEAST WET PREP: NONE SEEN

## 2015-06-22 MED ORDER — METRONIDAZOLE 500 MG PO TABS: 500.0000 mg | ORAL_TABLET | Freq: Two times a day (BID) | ORAL | Status: DC

## 2015-06-22 NOTE — ED Provider Notes (Signed)
CSN: [id number]     Arrival date & time 06/22/15  1347 History   First MD Initiated Contact with Patient 06/22/15 1419     Chief Complaint  Patient presents with  . Vaginal Itching    HPI   Alicia Morgan is a 24 y.o. F PMH significant for chlamydia presenting with a 1 week history of vaginal odor and itching. She states her sexual partner told her to get checked for STDs because he was told he had an "abnormal" urethral swab. She is unsure of his diagnosis. She endorses a skin tear on her labial folds. She states she is on her menstrual period (probably her last day of menses this month). No fevers, chills, abdominal pain, dysuria, vaginal discharge.   Past Medical History  Diagnosis Date  . Anemia   . Depression   . Headache   . Obesity   . Chlamydia   . Gestational hypertension 02/03/2015   Past Surgical History  Procedure Laterality Date  . Open reduction and internal fixation of ankle fracture.    . Fracture surgery      L tibia    No family history on file. Social History  Substance Use Topics  . Smoking status: Former Smoker    Types: Cigars    Quit date: 07/14/2014  . Smokeless tobacco: Never Used  . Alcohol Use: Yes     Comment: occasionally   OB History    Gravida Para Term Preterm AB TAB SAB Ectopic Multiple Living   0 1     Review of Systems  Ten systems are reviewed and are negative for acute change except as noted in the HPI  Allergies  Latex  Home Medications   Prior to Admission medications   Medication Sig Start Date End Date Taking? Authorizing Provider  propranolol (INDERAL) 40 MG tablet Take 40 mg by mouth every 8 (eight) hours as needed (anxiety).  06/21/15  Yes Historical Provider, MD  topiramate (TOPAMAX) 25 MG tablet Take 25 mg by mouth at bedtime. 06/21/15  Yes Historical Provider, MD  ferrous sulfate 325 (65 FE) MG tablet Take 1 tablet (325 mg total) by mouth 2 (two) times daily with a meal. Patient not taking:  Reported on 05/04/2015 02/05/15   Venus Standard, CNM  ibuprofen (ADVIL,MOTRIN) 600 MG tablet Take 1 tablet (600 mg total) by mouth every 6 (six) hours. Patient not taking: Reported on 05/04/2015 02/05/15   Venus Standard, CNM  metroNIDAZOLE (FLAGYL) 500 MG tablet Take 1 tablet (500 mg total) by mouth 2 (two) times daily. 06/22/15   Melton Krebs, PA-C  oxyCODONE-acetaminophen (PERCOCET/ROXICET) 5-325 MG per tablet Take 1 tablet by mouth every 4 (four) hours as needed (for pain scale 4-7). Patient not taking: Reported on 05/04/2015 02/05/15   Venus Standard, CNM  Prenatal Vit-Fe Fumarate-FA (PRENATAL COMPLETE) 14-0.4 MG TABS Take 1 tablet by mouth daily. Patient not taking: Reported on 05/04/2015 06/18/14   Garlon Hatchet, PA-C  terbinafine (LAMISIL) 250 MG tablet Take 1 tablet (250 mg total) by mouth daily. Patient not taking: Reported on 05/04/2015 04/22/15   Max T Hyatt, DPM   BP 139/81 mmHg  Pulse 62  Temp(Src) 98.1 F (36.7 C) (Oral)  Resp 18  Ht  (1.727 m)  Wt 308 lb (139.708 kg)  BMI 46.84 kg/m2  SpO2 99%  LMP 06/17/2015 Physical Exam  Constitutional: She appears well-developed and well-nourished. No distress.  HENT:  Head: Normocephalic and  atraumatic.  Mouth/Throat: Oropharynx is clear and moist. No oropharyngeal exudate.  Eyes: Conjunctivae are normal. Pupils are equal, round, and reactive to light. Right eye exhibits no discharge. Left eye exhibits no discharge. No scleral icterus.  Neck: No tracheal deviation present.  Cardiovascular: Normal rate, regular rhythm, normal heart sounds and intact distal pulses.  Exam reveals no gallop and no friction rub.   No murmur heard. Pulmonary/Chest: Effort normal and breath sounds normal. No respiratory distress. She has no wheezes. She has no rales. She exhibits no tenderness.  Abdominal: Soft. Bowel sounds are normal. She exhibits no distension and no mass. There is no tenderness. There is no rebound and no guarding.   Genitourinary: Uterus normal. Vaginal discharge found.  0.5 cm ulcerated lesion present at junction of anterior labial commissure and prepuce. No drainage, surrounding erythema, warmth.  Light brown vaginal discharge. No adnexal or cervical motion tenderness.   Musculoskeletal: Normal range of motion. She exhibits no edema.  Lymphadenopathy:    She has no cervical adenopathy.  Neurological: She is alert. Coordination normal.  Skin: Skin is warm and dry. No rash noted. She is not diaphoretic. No erythema.  Psychiatric: She has a normal mood and affect. Her behavior is normal.  Nursing note and vitals reviewed.   ED Course  Procedures  Labs Review Labs Reviewed  WET PREP, GENITAL - Abnormal; Notable for the following:    Clue Cells Wet Prep HPF POC FEW (*)    WBC, Wet Prep HPF POC FEW (*)    All other components within normal limits  HERPES SIMPLEX VIRUS CULTURE  RPR  HIV ANTIBODY (ROUTINE TESTING)  URINALYSIS, ROUTINE W REFLEX MICROSCOPIC (NOT AT [facility])  GC/CHLAMYDIA PROBE AMP ([place]) NOT AT [facility]    MDM   Final diagnoses:  Bacterial vaginosis   Pelvic exam performed. Patient requesting "full STD workup." Swabbed lesion for HSV.   Informed patient of the wet prep results positive for bacterial vaginosis and prescribed metronidazole. She has a small child present at bedside, but she is currently not breastfeeding.  Explained the rest of the results may take a few days to result. Patient may be safely discharged home. Discussed reasons for return. Patient to follow-up with primary care provider within one week. Patient in understanding and agreement with the plan.   Melton KrebsSamantha Nicole Shaterica Mcclatchy, PA-C 06/24/15 2134  Gwyneth SproutWhitney Plunkett, MD 06/25/15 [phone]

## 2015-06-22 NOTE — ED Notes (Signed)
Patient states that her sexual partner recommended that she go to get checked for STDs. Patient states that she began to notice a strong vaginal odor last Wednesday and wanted to get checked out. Reports torn skin between labia folds. Denies vaginal discharge. Currently on menstrual period.

## 2015-06-22 NOTE — Discharge Instructions (Signed)
Ms. Alicia Morgan,  Larence Penningice meeting you and your babies! Please take your metronidazole as prescribed for your bacterial vaginosis. Bacterial vaginosis is not a sexually transmitted disease. Please seek medical care if you develop fevers, do not improve on medication, or develop pain. Your other results should be back soon. Feel better soon! S. Lane HackerNicole Ikesha Siller, PA-C   Bacterial Vaginosis Bacterial vaginosis is a vaginal infection that occurs when the normal balance of bacteria in the vagina is disrupted. It results from an overgrowth of certain bacteria. This is the most common vaginal infection in women of childbearing age. Treatment is important to prevent complications, especially in pregnant women, as it can cause a premature delivery. CAUSES  Bacterial vaginosis is caused by an increase in harmful bacteria that are normally present in smaller amounts in the vagina. Several different kinds of bacteria can cause bacterial vaginosis. However, the reason that the condition develops is not fully understood. RISK FACTORS Certain activities or behaviors can put you at an increased risk of developing bacterial vaginosis, including:  Having a new sex partner or multiple sex partners.  Douching.  Using an intrauterine device (IUD) for contraception. Women do not get bacterial vaginosis from toilet seats, bedding, swimming pools, or contact with objects around them. SIGNS AND SYMPTOMS  Some women with bacterial vaginosis have no signs or symptoms. Common symptoms include:  Grey vaginal discharge.  A fishlike odor with discharge, especially after sexual intercourse.  Itching or burning of the vagina and vulva.  Burning or pain with urination. DIAGNOSIS  Your health care provider will take a medical history and examine the vagina for signs of bacterial vaginosis. A sample of vaginal fluid may be taken. Your health care provider will look at this sample under a microscope to check for bacteria and  abnormal cells. A vaginal pH test may also be done.  TREATMENT  Bacterial vaginosis may be treated with antibiotic medicines. These may be given in the form of a pill or a vaginal cream. A second round of antibiotics may be prescribed if the condition comes back after treatment. Because bacterial vaginosis increases your risk for sexually transmitted diseases, getting treated can help reduce your risk for chlamydia, gonorrhea, HIV, and herpes. HOME CARE INSTRUCTIONS   Only take over-the-counter or prescription medicines as directed by your health care provider.  If antibiotic medicine was prescribed, take it as directed. Make sure you finish it even if you start to feel better.  Tell all sexual partners that you have a vaginal infection. They should see their health care provider and be treated if they have problems, such as a mild rash or itching.  During treatment, it is important that you follow these instructions:  Avoid sexual activity or use condoms correctly.  Do not douche.  Avoid alcohol as directed by your health care provider.  Avoid breastfeeding as directed by your health care provider. SEEK MEDICAL CARE IF:   Your symptoms are not improving after 3 days of treatment.  You have increased discharge or pain.  You have a fever. MAKE SURE YOU:   Understand these instructions.  Will watch your condition.  Will get help right away if you are not doing well or get worse. FOR MORE INFORMATION  Centers for Disease Control and Prevention, Division of STD Prevention: SolutionApps.co.zawww.cdc.gov/std American Sexual Health Association (ASHA): www.ashastd.org    This information is not intended to replace advice given to you by your health care provider. Make sure you discuss any questions you have with  your health care provider.   Document Released: 07/31/2005 Document Revised: 08/21/2014 Document Reviewed: 03/12/2013 Elsevier Interactive Patient Education [employer]

## 2015-06-23 LAB — RPR: RPR: NONREACTIVE

## 2015-06-23 LAB — HIV ANTIBODY (ROUTINE TESTING W REFLEX): HIV SCREEN 4TH GENERATION: NONREACTIVE

## 2015-06-23 LAB — GC/CHLAMYDIA PROBE AMP (~~LOC~~) NOT AT ARMC
CHLAMYDIA, DNA PROBE: NEGATIVE
NEISSERIA GONORRHEA: NEGATIVE

## 2015-06-24 LAB — HERPES SIMPLEX VIRUS CULTURE: Culture: DETECTED

## 2015-06-25 ENCOUNTER — Telehealth (HOSPITAL_BASED_OUTPATIENT_CLINIC_OR_DEPARTMENT_OTHER): Payer: Self-pay | Admitting: Emergency Medicine

## 2015-06-25 NOTE — Progress Notes (Signed)
ED Antimicrobial Stewardship Positive Culture Follow Up   [name] is an 24 y.o. female who [facility] with a chief complaint of  Chief Complaint  Patient presents with  . Vaginal Itching    Recent Results (from the past 720 hour(s))  Wet prep, genital     Status: Abnormal   Collection Time: 06/22/15  3:35 PM  Result Value Ref Range Status   Yeast Wet Prep HPF POC NONE SEEN NONE SEEN Final   Trich, Wet Prep NONE SEEN NONE SEEN Final   Clue Cells Wet Prep HPF POC FEW (A) NONE SEEN Final   WBC, Wet Prep HPF POC FEW (A) NONE SEEN Final  Herpes simplex virus culture     Status: None   Collection Time: 06/22/15  3:45 PM  Result Value Ref Range Status   Specimen Description URETHRA  Final   Special Requests NONE  Final   Culture   Final    Herpes Simplex Type 2 detected. Performed at [facility]    Report Status 06/24/2015 FINAL  Final    [x]  Patient discharged originally without antimicrobial agent and treatment is now indicated  New antibiotic prescription:   If treatment naive:  -valacyclovir 1g BID x7 days  If treated previously: -valacyclovir 500mg  x3 days  ED Provider: Sharilyn SitesLisa Sanders, PA-C   Reuel Derbyobert J Vincent, PharmD. 06/25/2015, 8:38 AM PGY1 Resident  Phone# [phone]

## 2015-06-26 ENCOUNTER — Telehealth (HOSPITAL_BASED_OUTPATIENT_CLINIC_OR_DEPARTMENT_OTHER): Payer: Self-pay | Admitting: Emergency Medicine

## 2015-06-26 NOTE — Telephone Encounter (Signed)
Post ED Visit - Positive Culture Follow-up: Successful Patient Follow-Up  Culture assessed and recommendations reviewed by: []  Enzo BiNathan Batchelder, Pharm.D. []  Celedonio MiyamotoJeremy Frens, Pharm.D., BCPS []  Garvin FilaMike Maccia, Pharm.D. []  Georgina PillionElizabeth Martin, Pharm.D., BCPS []  LibertyvilleMinh Pham, [address]Pharm[address]D., BCPS, AAHIVP []  Estella HuskMichelle Turner, Pharm.D., BCPS, AAHIVP []  Tennis Mustassie Stewart, Pharm.D. [x]  Sherle Poeob Vincent, [address]Ph[address]  Positive HSV  culture  [x]  Patient discharged without antimicrobial prescription and treatment is now indicated []  Organism is resistant to prescribed ED discharge antimicrobial []  Patient with positive blood cultures  Changes discussed with ED provider: [name] New antibiotic prescription Valacyclovir 1 gram bid x 7 days [facility]  Contacted patient, 06/26/15 1104   [name], Alicia Ihde 06/26/2015, 5:04 PM

## 2015-07-02 ENCOUNTER — Other Ambulatory Visit: Payer: Self-pay | Admitting: Podiatry

## 2015-07-14 ENCOUNTER — Emergency Department (HOSPITAL_COMMUNITY)
Admission: EM | Admit: 2015-07-14 | Discharge: 2015-07-15 | Disposition: A | Discharge status: Home / Self Care | Payer: Medicaid Other | Source: Home / Self Care | Attending: Emergency Medicine | Admitting: Emergency Medicine

## 2015-07-14 ENCOUNTER — Encounter (HOSPITAL_COMMUNITY): Payer: Self-pay | Admitting: Neurology

## 2015-07-14 ENCOUNTER — Emergency Department (HOSPITAL_COMMUNITY)
Admission: EM | Admit: 2015-07-14 | Discharge: 2015-07-14 | Discharge status: Against Medical Advice | Payer: Medicaid Other | Attending: Emergency Medicine | Admitting: Emergency Medicine

## 2015-07-14 ENCOUNTER — Encounter (HOSPITAL_COMMUNITY): Payer: Self-pay | Admitting: Emergency Medicine

## 2015-07-14 DIAGNOSIS — N390 Urinary tract infection, site not specified: Secondary | ICD-10-CM | POA: Insufficient documentation

## 2015-07-14 DIAGNOSIS — E669 Obesity, unspecified: Secondary | ICD-10-CM | POA: Insufficient documentation

## 2015-07-14 DIAGNOSIS — F17200 Nicotine dependence, unspecified, uncomplicated: Secondary | ICD-10-CM | POA: Diagnosis not present

## 2015-07-14 DIAGNOSIS — F17210 Nicotine dependence, cigarettes, uncomplicated: Secondary | ICD-10-CM | POA: Insufficient documentation

## 2015-07-14 DIAGNOSIS — R3915 Urgency of urination: Secondary | ICD-10-CM | POA: Insufficient documentation

## 2015-07-14 DIAGNOSIS — R350 Frequency of micturition: Secondary | ICD-10-CM | POA: Diagnosis not present

## 2015-07-14 DIAGNOSIS — Z8619 Personal history of other infectious and parasitic diseases: Secondary | ICD-10-CM | POA: Insufficient documentation

## 2015-07-14 DIAGNOSIS — D649 Anemia, unspecified: Secondary | ICD-10-CM | POA: Insufficient documentation

## 2015-07-14 DIAGNOSIS — R1032 Left lower quadrant pain: Secondary | ICD-10-CM | POA: Insufficient documentation

## 2015-07-14 DIAGNOSIS — R197 Diarrhea, unspecified: Secondary | ICD-10-CM | POA: Diagnosis not present

## 2015-07-14 DIAGNOSIS — R319 Hematuria, unspecified: Principal | ICD-10-CM

## 2015-07-14 DIAGNOSIS — F329 Major depressive disorder, single episode, unspecified: Secondary | ICD-10-CM | POA: Insufficient documentation

## 2015-07-14 DIAGNOSIS — Z3202 Encounter for pregnancy test, result negative: Secondary | ICD-10-CM | POA: Insufficient documentation

## 2015-07-14 DIAGNOSIS — Z91040 Latex allergy status: Secondary | ICD-10-CM | POA: Insufficient documentation

## 2015-07-14 DIAGNOSIS — R110 Nausea: Secondary | ICD-10-CM | POA: Insufficient documentation

## 2015-07-14 DIAGNOSIS — Z79899 Other long term (current) drug therapy: Secondary | ICD-10-CM | POA: Insufficient documentation

## 2015-07-14 LAB — URINE MICROSCOPIC-ADD ON

## 2015-07-14 LAB — POC URINE PREG, ED: PREG TEST UR: NEGATIVE

## 2015-07-14 LAB — URINALYSIS, ROUTINE W REFLEX MICROSCOPIC
BILIRUBIN URINE: NEGATIVE
Glucose, UA: NEGATIVE mg/dL
KETONES UR: NEGATIVE mg/dL
NITRITE: POSITIVE — AB
Protein, ur: 30 mg/dL — AB
Specific Gravity, Urine: 1.014 (ref 1.005–1.030)
pH: 6.5 (ref 5.0–8.0)

## 2015-07-14 LAB — CBC
HCT: 37.4 % (ref 36.0–46.0)
Hemoglobin: 12.2 g/dL (ref 12.0–15.0)
MCH: 27.4 pg (ref 26.0–34.0)
MCHC: 32.6 g/dL (ref 30.0–36.0)
MCV: 84 fL (ref 78.0–100.0)
PLATELETS: 292 10*3/uL (ref 150–400)
RBC: 4.45 MIL/uL (ref 3.87–5.11)
RDW: 15.1 % (ref 11.5–15.5)
WBC: 7.7 10*3/uL (ref 4.0–10.5)

## 2015-07-14 LAB — I-STAT BETA HCG BLOOD, ED (MC, WL, AP ONLY)

## 2015-07-14 LAB — COMPREHENSIVE METABOLIC PANEL
ALBUMIN: 3.9 g/dL (ref 3.5–5.0)
ALT: 21 U/L (ref 14–54)
AST: 19 U/L (ref 15–41)
Alkaline Phosphatase: 68 U/L (ref 38–126)
Anion gap: 7 (ref 5–15)
BUN: 7 mg/dL (ref 6–20)
CALCIUM: 9.1 mg/dL (ref 8.9–10.3)
CHLORIDE: 103 mmol/L (ref 101–111)
CO2: 26 mmol/L (ref 22–32)
CREATININE: 0.67 mg/dL (ref 0.44–1.00)
GFR calc Af Amer: >60 mL/min (ref >60)
GFR calc non Af Amer: >60 mL/min (ref >60)
Glucose, Bld: 85 mg/dL (ref 65–99)
POTASSIUM: 3.8 mmol/L (ref 3.5–5.1)
SODIUM: 136 mmol/L (ref 135–145)
TOTAL PROTEIN: 7.3 g/dL (ref 6.5–8.1)
Total Bilirubin: 0.9 mg/dL (ref 0.3–1.2)

## 2015-07-14 LAB — LIPASE, BLOOD: LIPASE: 22 U/L (ref 11–51)

## 2015-07-14 MED ORDER — CEPHALEXIN 500 MG PO CAPS
500.0000 mg | ORAL_CAPSULE | Freq: Once | ORAL | Status: AC
  Administered 2015-07-14: 500 mg via ORAL
  Filled 2015-07-14: qty 1

## 2015-07-14 MED ORDER — PROMETHAZINE HCL 25 MG/ML IJ SOLN
25.0000 mg | Freq: Once | INTRAMUSCULAR | Status: AC
  Administered 2015-07-14: 25 mg via INTRAVENOUS
  Filled 2015-07-14: qty 1

## 2015-07-14 MED ORDER — CEPHALEXIN 500 MG PO CAPS: 500.0000 mg | ORAL_CAPSULE | Freq: Two times a day (BID) | ORAL | Status: DC

## 2015-07-14 MED ORDER — DIPHENHYDRAMINE HCL 50 MG/ML IJ SOLN
25.0000 mg | Freq: Once | INTRAMUSCULAR | Status: AC
  Administered 2015-07-14: 25 mg via INTRAVENOUS
  Filled 2015-07-14: qty 1

## 2015-07-14 NOTE — ED Provider Notes (Signed)
CSN: [id number]     Arrival date & time 07/14/15  1938 History   First MD Initiated Contact with Patient 07/14/15 2122     Chief Complaint  Patient presents with  . Urinary Frequency    HPI   Alicia Morgan is an 24 y.o. female with history of chronic headaches, depression, and anemia who presents to the ED for evaluation of urinary frequency and L flank pain. She states that she first started noticing increased urinary frequency and urgency ~2-3 weeks ago. Denies dysuria. She states she drank cranberry juice and thought her symptoms were resolving but over the past two days has now developed progressive L flank pain. She states the pain starts in her back and feels like it wraps around to her LLQ. She is unsure if she has hematuria as she is currently on her period. Endorses mild nausea but denies emesis. Denies fever at home. She does note she has been feeling more fatigued than usual but states she thinks it is because she is always so busy with work during the day and usually does not eat all day until she gets home. Denies vaginal discharge or lesions. Denies chest pain or SOB. She states that now that she is in the ED she is developing a headache. States it feels like her headache is behind her eyes. She reports a history of recurrent headaches and migraines and her headache feels similar to prior episodes. Reports that she usually feels relief with migraine cocktail. Endorses photophobia. Denies dizziness, weakness, numbness, tingling.  Past Medical History  Diagnosis Date  . Anemia   . Depression   . Headache   . Obesity   . Chlamydia   . Gestational hypertension 02/03/2015   Past Surgical History  Procedure Laterality Date  . Open reduction and internal fixation of ankle fracture.    . Fracture surgery      L tibia    History reviewed. No pertinent family history. Social History  Substance Use Topics  . Smoking status: Current Every Day Smoker    Types: Cigars    Last Attempt  to Quit: 07/14/2014  . Smokeless tobacco: Never Used  . Alcohol Use: Yes     Comment: occasionally   OB History    Gravida Para Term Preterm AB TAB SAB Ectopic Multiple Living   0 1     Review of Systems  All other systems reviewed and are negative.     Allergies  Latex  Home Medications   Prior to Admission medications   Medication Sig Start Date End Date Taking? Authorizing Provider  ALPRAZolam (XANAX) 0.25 MG tablet Take 0.25 mg by mouth 2 (two) times daily as needed for anxiety.   Yes Historical Provider, MD  propranolol (INDERAL) 40 MG tablet Take 40 mg by mouth every 8 (eight) hours as needed (anxiety).  06/21/15  Yes Historical Provider, MD  cephALEXin (KEFLEX) 500 MG capsule Take 1 capsule (500 mg total) by mouth 2 (two) times daily. 07/14/15   Carlene Coria, PA-C  ferrous sulfate 325 (65 FE) MG tablet Take 1 tablet (325 mg total) by mouth 2 (two) times daily with a meal. Patient not taking: Reported on 05/04/2015 02/05/15   Venus Standard, CNM  ibuprofen (ADVIL,MOTRIN) 600 MG tablet Take 1 tablet (600 mg total) by mouth every 6 (six) hours. Patient not taking: Reported on 05/04/2015 02/05/15   Venus Standard, CNM  metroNIDAZOLE (FLAGYL) 500 MG tablet Take 1  tablet (500 mg total) by mouth 2 (two) times daily. 06/22/15   Melton KrebsSamantha Nicole Riley, PA-C  oxyCODONE-acetaminophen (PERCOCET/ROXICET) 5-325 MG per tablet Take 1 tablet by mouth every 4 (four) hours as needed (for pain scale 4-7). Patient not taking: Reported on 05/04/2015 02/05/15   Venus Standard, CNM  Prenatal Vit-Fe Fumarate-FA (PRENATAL COMPLETE) 14-0.4 MG TABS Take 1 tablet by mouth daily. Patient not taking: Reported on 05/04/2015 06/18/14   Garlon HatchetLisa M Sanders, PA-C  terbinafine (LAMISIL) 250 MG tablet Take 1 tablet (250 mg total) by mouth daily. Patient not taking: Reported on 05/04/2015 04/22/15   Max T Hyatt, DPM   BP 140/75 mmHg  Pulse 98  Temp(Src) 98.5 F (36.9 C) (Oral)  Resp 22  SpO2 99%  LMP  07/14/2015 Physical Exam  Constitutional: She is oriented to person, place, and time. No distress.  HENT:  Right Ear: External ear normal.  Left Ear: External ear normal.  Nose: Nose normal.  Mouth/Throat: Oropharynx is clear and moist. No oropharyngeal exudate.  Eyes: Conjunctivae and EOM are normal. Pupils are equal, round, and reactive to light.  Neck: Normal range of motion. Neck supple.  Cardiovascular: Normal rate, regular rhythm, normal heart sounds and intact distal pulses.   No murmur heard. Pulmonary/Chest: Effort normal and breath sounds normal. No respiratory distress. She has no wheezes.  Abdominal: Soft. Bowel sounds are normal. She exhibits no distension. There is tenderness in the left upper quadrant and left lower quadrant. There is no rebound and no guarding.  + L CVA tenderness  Musculoskeletal: She exhibits no edema.  Neurological: She is alert and oriented to person, place, and time. No cranial nerve deficit.  Skin: Skin is warm and dry. She is not diaphoretic. No pallor.  Psychiatric: She has a normal mood and affect.  Nursing note and vitals reviewed.   ED Course  Procedures (including critical care time) Labs Review Labs Reviewed  URINALYSIS, ROUTINE W REFLEX MICROSCOPIC (NOT AT [facility]) - Abnormal; Notable for the following:    APPearance CLOUDY (*)    Hgb urine dipstick LARGE (*)    Protein, ur 30 (*)    Nitrite POSITIVE (*)    Leukocytes, UA MODERATE (*)    All other components within normal limits  URINE MICROSCOPIC-ADD ON - Abnormal; Notable for the following:    Squamous Epithelial / LPF 0-5 (*)    Bacteria, UA MANY (*)    All other components within normal limits  URINE CULTURE  POC URINE PREG, ED    Imaging Review No results found. I have personally reviewed and evaluated these images and lab results as part of my medical decision-making.   EKG Interpretation None      MDM   Final diagnoses:  Urinary tract infection with hematuria,  site unspecified    UA shows positive nitrites and moderate leuks, consistent with UTI. Pt does have CVA tenderness. However she is afebrile, non toxic. Will give first dose of PO keflex here and give 7 day course of Keflex for home. Will send urine for culture. Strict ER return precautions given but I anticipate that pt will feel much improved with abx therapy.  Regarding pt's headache, seems consistent with prior episodes. Neuro exam is nonfocal. Will give benadryl and phenergan here.   Pt reports improvement in pain. VS are unremarakble and stable. Pt is stable for discharge. ER return precautions given.    Carlene CoriaSerena Y Guerin Lashomb, PA-C 07/15/15 1006  Marily MemosJason Mesner, MD 07/16/15 [phone]

## 2015-07-14 NOTE — ED Notes (Signed)
Spoke with patient regarding wait time. Patient states she would like to go home and lay down. Encouraged patient to wait to be seen. Patient states she is going to leave and will come back if she doesn't feel better after rest.

## 2015-07-14 NOTE — ED Notes (Signed)
Pt reports urinary urgency, frequency last week that went away but came back this week. Also has been having GI problems, n/d. Has LLQ abd pain, thinks she may have a UTI. Is having soreness when urinating.

## 2015-07-14 NOTE — ED Notes (Signed)
Pt states that she has had urinary frequency x 2 weeks and started having L sided flank pain today. Denies dysuria. Alert and oriented.

## 2015-07-14 NOTE — Discharge Instructions (Signed)
You were seen in the ER for evaluation of urinary frequency and flank pain. Your urine shows evidence of a urinary tract infection. We gave you the first dose of antibiotics (Keflex) in the ER today. I will give you a one week course. Please take as directed until you finish the bottle. Drink plenty of fluids. Please follow-up with your primary care provider within one week. Return to the ER for new or worsening symptoms such as high fever, worsening pain, urinary obstruction, etc.

## 2015-07-16 LAB — URINE CULTURE

## 2015-11-21 ENCOUNTER — Encounter (HOSPITAL_COMMUNITY): Payer: Self-pay

## 2015-11-21 ENCOUNTER — Emergency Department (HOSPITAL_COMMUNITY)
Admission: EM | Admit: 2015-11-21 | Discharge: 2015-11-21 | Disposition: A | Discharge status: Home / Self Care | Payer: Medicaid Other | Attending: Emergency Medicine | Admitting: Emergency Medicine

## 2015-11-21 DIAGNOSIS — Z91040 Latex allergy status: Secondary | ICD-10-CM | POA: Insufficient documentation

## 2015-11-21 DIAGNOSIS — Z862 Personal history of diseases of the blood and blood-forming organs and certain disorders involving the immune mechanism: Secondary | ICD-10-CM | POA: Insufficient documentation

## 2015-11-21 DIAGNOSIS — E669 Obesity, unspecified: Secondary | ICD-10-CM | POA: Insufficient documentation

## 2015-11-21 DIAGNOSIS — Z79899 Other long term (current) drug therapy: Secondary | ICD-10-CM | POA: Insufficient documentation

## 2015-11-21 DIAGNOSIS — F329 Major depressive disorder, single episode, unspecified: Secondary | ICD-10-CM | POA: Insufficient documentation

## 2015-11-21 DIAGNOSIS — F17210 Nicotine dependence, cigarettes, uncomplicated: Secondary | ICD-10-CM | POA: Insufficient documentation

## 2015-11-21 DIAGNOSIS — F10929 Alcohol use, unspecified with intoxication, unspecified: Secondary | ICD-10-CM

## 2015-11-21 DIAGNOSIS — Z8619 Personal history of other infectious and parasitic diseases: Secondary | ICD-10-CM | POA: Insufficient documentation

## 2015-11-21 DIAGNOSIS — F10120 Alcohol abuse with intoxication, uncomplicated: Secondary | ICD-10-CM | POA: Insufficient documentation

## 2015-11-21 NOTE — Discharge Instructions (Signed)
Alcohol Intoxication °Alcohol intoxication occurs when the amount of alcohol that a person has consumed impairs his or her ability to mentally and physically function. Alcohol directly impairs the normal chemical activity of the brain. Drinking large amounts of alcohol can lead to changes in mental function and behavior, and it can cause many physical effects that can be harmful.  °Alcohol intoxication can range in severity from mild to very severe. Various factors can affect the level of intoxication that occurs, such as the person's age, gender, weight, frequency of alcohol consumption, and the presence of other medical conditions (such as diabetes, seizures, or heart conditions). Dangerous levels of alcohol intoxication may occur when people drink large amounts of alcohol in a short period (binge drinking). Alcohol can also be especially dangerous when combined with certain prescription medicines or "recreational" drugs. °SIGNS AND SYMPTOMS °Some common signs and symptoms of mild alcohol intoxication include: °· Loss of coordination. °· Changes in mood and behavior. °· Impaired judgment. °· Slurred speech. °As alcohol intoxication progresses to more severe levels, other signs and symptoms will appear. These may include: °· Vomiting. °· Confusion and impaired memory. °· Slowed breathing. °· Seizures. °· Loss of consciousness. °DIAGNOSIS  °Your health care provider will take a medical history and perform a physical exam. You will be asked about the amount and type of alcohol you have consumed. Blood tests will be done to measure the concentration of alcohol in your blood. In many places, your blood alcohol level must be lower than 80 mg/dL (0.08%) to legally drive. However, many dangerous effects of alcohol can occur at much lower levels.  °TREATMENT  °People with alcohol intoxication often do not require treatment. Most of the effects of alcohol intoxication are temporary, and they go away as the alcohol naturally  leaves the body. Your health care provider will monitor your condition until you are stable enough to go home. Fluids are sometimes given through an IV access tube to help prevent dehydration.  °HOME CARE INSTRUCTIONS °· Do not drive after drinking alcohol. °· Stay hydrated. Drink enough water and fluids to keep your urine clear or pale yellow. Avoid caffeine.   °· Only take over-the-counter or prescription medicines as directed by your health care provider.   °SEEK MEDICAL CARE IF:  °· You have persistent vomiting.   °· You do not feel better after a few days. °· You have frequent alcohol intoxication. Your health care provider can help determine if you should see a substance use treatment counselor. °SEEK IMMEDIATE MEDICAL CARE IF:  °· You become shaky or tremble when you try to stop drinking.   °· You shake uncontrollably (seizure).   °· You throw up (vomit) blood. This may be bright red or may look like black coffee grounds.   °· You have blood in your stool. This may be bright red or may appear as a black, tarry, bad smelling stool.   °· You become lightheaded or faint.   °MAKE SURE YOU:  °· Understand these instructions. °· Will watch your condition. °· Will get help right away if you are not doing well or get worse. °  °This information is not intended to replace advice given to you by your health care provider. Make sure you discuss any questions you have with your health care provider. °  °Document Released: 05/10/2005 Document Revised: 04/02/2013 Document Reviewed: 01/03/2013 °Elsevier Interactive Patient Education ©2016 Elsevier Inc. ° °Alcohol Withdrawal °Alcohol withdrawal is a group of symptoms that can develop when a person who drinks heavily and regularly stops   or drinks less. CAUSES Heavy and regular drinking can cause chemicals that send signals from the brain to the body (neurotransmitters) to deactivate. Alcohol withdrawal develops when deactivated neurotransmitters reactivate because a  person stops drinking or drinks less. RISK FACTORS The more a person drinks and the longer he or she drinks, the greater the risk of alcohol withdrawal. Severe withdrawal is more likely to develop in someone who:  Had severe alcohol withdrawal in the past.  Had a seizure during a previous episode of alcohol withdrawal.  Is elderly.  Is pregnant.  Has been abusing drugs.  Has other medical problems, including:  Infection.  Heart, lung, or liver disease.  Seizures.  Mental health problems. SYMPTOMS Symptoms of this condition can be mild to moderate, or they can be severe. Mild to moderate symptoms may include:  Fatigue.  Nightmares.  Trouble sleeping.  Depression.  Anxiety.  Inability to think clearly.  Mood swings.  Irritability.  Loss of appetite.  Nausea or vomiting.  Clammy skin.  Extreme sweating.  Rapid heartbeat.  Shakiness.  Uncontrollable shaking (tremor). Severe symptoms may include:  Fever.  Seizures.  Severeconfusion.  Feeling or seeing things that are not there (hallucinations). Symptoms usually begin within eight hours after a person stops drinking or drinks less. They can last for weeks. DIAGNOSIS Alcohol withdrawal is diagnosed with a medical history and physical exam. Sometimes, urine and blood tests are also done. TREATMENT Treatment may involve:  Monitoring blood pressure, pulse, and breathing.  Getting fluids through an IV tube.  Medicine to reduce anxiety.  Medicine to prevent or control seizures.  Multivitamins and B vitamins.  Having a health care provider check on you daily. If symptoms are moderate to severe or if there is a risk of severe withdrawal, treatment may be done at a hospital or treatment center. HOME CARE INSTRUCTIONS  Take medicines and vitamin supplements only as directed by your health care provider.  Do not drink alcohol.  Have someone stay with you or be available if you need  help.  Drink enough fluid to keep your urine clear or pale yellow.  Consider joining a 12-step program or another alcohol support group. SEEK MEDICAL CARE IF:  Your symptoms get worse or do not go away.  You cannot keep food or water in your stomach.  You are struggling with not drinking alcohol.  You cannot stop drinking alcohol. SEEK IMMEDIATE MEDICAL CARE IF:   You have an irregular heartbeat.  You have chest pain.  You have trouble breathing.  You have symptoms of severe withdrawal, such as:  A fever.  Seizures.  Severe confusion.  Hallucinations.   This information is not intended to replace advice given to you by your health care provider. Make sure you discuss any questions you have with your health care provider.   Document Released: 05/10/2005 Document Revised: 08/21/2014 Document Reviewed: 05/19/2014 Elsevier Interactive Patient Education [employer]2016 Elsevier Inc.

## 2015-11-21 NOTE — ED Provider Notes (Signed)
CSN: [id number]     Arrival date & time 11/21/15  1348 History   First MD Initiated Contact with Patient 11/21/15 1410     Chief Complaint  Patient presents with  . Tachycardia     (Consider location/radiation/quality/duration/timing/severity/associated sxs/prior Treatment) Patient is a 25 y.o. female presenting with drug/alcohol assessment. The history is provided by the patient.  Drug / Alcohol Assessment Similar prior episodes: no   Severity:  Moderate Onset quality:  Gradual Duration:  1 day Timing:  Constant Progression:  Resolved Chronicity:  New Suspected agents:  Alcohol Associated symptoms: blackouts   Associated symptoms: no abdominal pain, no bladder incontinence, no bowel incontinence and no loss of consciousness     Past Medical History  Diagnosis Date  . Anemia   . Depression   . Headache   . Obesity   . Chlamydia   . Gestational hypertension 02/03/2015   Past Surgical History  Procedure Laterality Date  . Open reduction and internal fixation of ankle fracture.    . Fracture surgery      L tibia    No family history on file. Social History  Substance Use Topics  . Smoking status: Current Every Day Smoker    Types: Cigars    Last Attempt to Quit: 07/14/2014  . Smokeless tobacco: Never Used  . Alcohol Use: Yes     Comment: occasionally   OB History    Gravida Para Term Preterm AB TAB SAB Ectopic Multiple Living   0 1     Review of Systems  Gastrointestinal: Negative for abdominal pain and bowel incontinence.  Genitourinary: Negative for bladder incontinence.  Neurological: Negative for loss of consciousness.  All other systems reviewed and are negative.     Allergies  Latex  Home Medications   Prior to Admission medications   Medication Sig Start Date End Date Taking? Authorizing Provider  ALPRAZolam (XANAX) 0.25 MG tablet Take 0.25 mg by mouth 2 (two) times daily as needed for anxiety.   Yes Historical Provider, MD   propranolol (INDERAL) 40 MG tablet Take 40 mg by mouth every 8 (eight) hours as needed (anxiety).  06/21/15  Yes Historical Provider, MD   BP 125/83 mmHg  Pulse 94  Temp(Src) 98.5 F (36.9 C) (Oral)  Resp 16  SpO2 100%  LMP 10/22/2015 (Approximate) Physical Exam  Constitutional: She is oriented to person, place, and time. She appears well-developed and well-nourished. No distress.  HENT:  Head: Normocephalic.  Eyes: Conjunctivae are normal.  Neck: Neck supple. No tracheal deviation present.  Cardiovascular: Normal rate, regular rhythm and normal heart sounds.   Pulmonary/Chest: Effort normal and breath sounds normal. No respiratory distress.  Abdominal: Soft. She exhibits no distension.  Neurological: She is alert and oriented to person, place, and time. She has normal strength. No cranial nerve deficit or sensory deficit. Coordination and gait normal. GCS eye subscore is 4. GCS verbal subscore is 5. GCS motor subscore is 6.  Skin: Skin is warm and dry.  Psychiatric: She has a normal mood and affect.    ED Course  Procedures (including critical care time) Labs Review Labs Reviewed - No data to display  Imaging Review No results found. I have personally reviewed and evaluated these images and lab results as part of my medical decision-making.   EKG Interpretation None      MDM   Final diagnoses:  Alcohol intoxication, with unspecified complication (HCC)   25 y.o. female  presents with alcohol intoxication last night. She drank a beer and a [place] iced tea and then is unsure about the following events. She apparently was with friends but left, drove home and made food but didn't eat it, called several friends speaking nonsensically and [place] to [place]. She never really slept but came back to consciousness and started feeling sweaty, shaking and felt palpitations. She is minimally tachycardic on arrival that resolves with reassurance. She is not a  heavy drinker and drank more than usual, lost track and apparently had a blackout episode now experiencing mild alcohol withdrawal. I recommended the patient be very careful to moderate drinking, not have access to a motor vehicle if possible, and do supportive care to help with her hangover. Plan to follow up with PCP as needed and return precautions discussed for worsening or new concerning symptoms.     Lyndal Pulleyaniel Jarryd Gratz, MD 11/21/15 [phone]
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
## 2016-02-22 ENCOUNTER — Inpatient Hospital Stay (HOSPITAL_COMMUNITY)
Admission: AD | Admit: 2016-02-22 | Discharge: 2016-02-22 | Disposition: A | Discharge status: Home / Self Care | Payer: Medicaid Other | Source: Ambulatory Visit | Attending: Obstetrics & Gynecology | Admitting: Obstetrics & Gynecology

## 2016-02-22 ENCOUNTER — Encounter (HOSPITAL_COMMUNITY): Payer: Self-pay | Admitting: *Deleted

## 2016-02-22 DIAGNOSIS — F17200 Nicotine dependence, unspecified, uncomplicated: Secondary | ICD-10-CM | POA: Insufficient documentation

## 2016-02-22 DIAGNOSIS — N930 Postcoital and contact bleeding: Secondary | ICD-10-CM | POA: Insufficient documentation

## 2016-02-22 DIAGNOSIS — E669 Obesity, unspecified: Secondary | ICD-10-CM | POA: Insufficient documentation

## 2016-02-22 DIAGNOSIS — R300 Dysuria: Secondary | ICD-10-CM | POA: Insufficient documentation

## 2016-02-22 DIAGNOSIS — D6489 Other specified anemias: Secondary | ICD-10-CM | POA: Insufficient documentation

## 2016-02-22 DIAGNOSIS — Z91040 Latex allergy status: Secondary | ICD-10-CM | POA: Insufficient documentation

## 2016-02-22 DIAGNOSIS — R51 Headache: Secondary | ICD-10-CM | POA: Insufficient documentation

## 2016-02-22 DIAGNOSIS — Z8619 Personal history of other infectious and parasitic diseases: Secondary | ICD-10-CM | POA: Insufficient documentation

## 2016-02-22 DIAGNOSIS — Z3202 Encounter for pregnancy test, result negative: Secondary | ICD-10-CM | POA: Insufficient documentation

## 2016-02-22 DIAGNOSIS — R1030 Lower abdominal pain, unspecified: Secondary | ICD-10-CM | POA: Insufficient documentation

## 2016-02-22 DIAGNOSIS — N939 Abnormal uterine and vaginal bleeding, unspecified: Secondary | ICD-10-CM

## 2016-02-22 LAB — URINALYSIS, ROUTINE W REFLEX MICROSCOPIC
BILIRUBIN URINE: NEGATIVE
Glucose, UA: NEGATIVE mg/dL
KETONES UR: NEGATIVE mg/dL
LEUKOCYTES UA: NEGATIVE
NITRITE: NEGATIVE
Protein, ur: NEGATIVE mg/dL
Specific Gravity, Urine: >1.03 — ABNORMAL HIGH (ref 1.005–1.030)
pH: 6 (ref 5.0–8.0)

## 2016-02-22 LAB — WET PREP, GENITAL
Clue Cells Wet Prep HPF POC: NONE SEEN
Sperm: NONE SEEN
TRICH WET PREP: NONE SEEN
YEAST WET PREP: NONE SEEN

## 2016-02-22 LAB — URINE MICROSCOPIC-ADD ON

## 2016-02-22 LAB — POCT PREGNANCY, URINE: Preg Test, Ur: NEGATIVE

## 2016-02-22 NOTE — MAU Note (Signed)
Pt presents complaining of vaginal bleeding after intercourse. Pt states she had rough intercourse and started bleeding. Having pain with urination. Requesting STD testing. Also having lower abdominal pain. LMP 02/11/16

## 2016-02-22 NOTE — MAU Provider Note (Signed)
History     CSN: [id number]  Arrival date and time: 02/22/16 1809   None     Chief Complaint  Patient presents with  . Vaginal Bleeding   HPIpt is not pregnant [id number] uses condoms for sex with FOB and has oral sex with another partner.  Pt c/o of vaginal bleeding ?tear after "rough sex" with partners fingers.  Partner noticed blood on fingers.  Pt concerned she may have a vaginal tear. No bleeding now. Pt is not using anything for contraception. Pt c/o of mild lower abdominal cramping. Pt's LMP 02/11/2016 RN note: Pt presents complaining of vaginal bleeding after intercourse. Pt states she had rough intercourse and started bleeding. Having pain with urination. Requesting STD testing. Also having lower abdominal pain. LMP 02/11/16  Past Medical History  Diagnosis Date  . Anemia   . Depression   . Headache   . Obesity   . Chlamydia   . Gestational hypertension 02/03/2015    Past Surgical History  Procedure Laterality Date  . Open reduction and internal fixation of ankle fracture.    . Fracture surgery      L tibia     History reviewed. No pertinent family history.  Social History  Substance Use Topics  . Smoking status: Current Every Day Smoker    Types: Cigars    Last Attempt to Quit: 07/14/2014  . Smokeless tobacco: Never Used  . Alcohol Use: Yes     Comment: occasionally    Allergies:  Allergies  Allergen Reactions  . Latex Rash    Prescriptions prior to admission  Medication Sig Dispense Refill Last Dose  . ALPRAZolam (XANAX) 0.25 MG tablet Take 0.25 mg by mouth 2 (two) times daily as needed for anxiety.   Past Week at Unknown time  . propranolol (INDERAL) 40 MG tablet Take 40 mg by mouth every 8 (eight) hours as needed (anxiety).   0 Past Week at Unknown time    Review of Systems  Constitutional: Negative for fever and chills.  Gastrointestinal: Positive for abdominal pain and constipation. Negative for nausea, vomiting and diarrhea.  Genitourinary:  Negative for dysuria.  Neurological: Negative for headaches.   Physical Exam   Blood pressure 166/92, pulse 77, temperature 98.3 F (36.8 C), temperature source Oral, resp. rate 18, last menstrual period 02/11/2016, unknown if currently breastfeeding.  Physical Exam  Nursing note and vitals reviewed. Constitutional: She is oriented to person, place, and time. She appears well-developed and well-nourished. No distress.  HENT:  Head: Normocephalic.  Eyes: Pupils are equal, round, and reactive to light.  Neck: Normal range of motion. Neck supple.  Cardiovascular: Normal rate.   Respiratory: Effort normal.  GI: Soft. She exhibits no distension. There is no tenderness. There is no rebound and no guarding.  Genitourinary: Vagina normal.  Normal external female genitalia- no tears, fissures or lesions noted; vaginal clean; small amount of white discharge in vault; cervix clean, NT; uterus NSSC NT; andexa without palpable enlargement or tenderness  Musculoskeletal: Normal range of motion.  Neurological: She is alert and oriented to person, place, and time.  Skin: Skin is warm and dry.  Psychiatric: She has a normal mood and affect.    MAU Course  Procedures  Results for orders placed or performed during the hospital encounter of 02/22/16 (from the past 24 hour(s))  Urinalysis, Routine w reflex microscopic (not at [facility])     Status: Abnormal   Collection Time: 02/22/16  7:11 PM  Result Value Ref Range  Color, Urine YELLOW YELLOW   APPearance CLEAR CLEAR   Specific Gravity, Urine >1.030 (H) 1.005 - 1.030   pH 6.0 5.0 - 8.0   Glucose, UA NEGATIVE NEGATIVE mg/dL   Hgb urine dipstick MODERATE (A) NEGATIVE   Bilirubin Urine NEGATIVE NEGATIVE   Ketones, ur NEGATIVE NEGATIVE mg/dL   Protein, ur NEGATIVE NEGATIVE mg/dL   Nitrite NEGATIVE NEGATIVE   Leukocytes, UA NEGATIVE NEGATIVE  Urine microscopic-add on     Status: Abnormal   Collection Time: 02/22/16  7:11 PM  Result Value Ref Range    Squamous Epithelial / LPF 0-5 (A) NONE SEEN   WBC, UA 0-5 0 - 5 WBC/hpf   RBC / HPF 0-5 0 - 5 RBC/hpf   Bacteria, UA FEW (A) NONE SEEN  Pregnancy, urine POC     Status: None   Collection Time: 02/22/16  7:24 PM  Result Value Ref Range   Preg Test, Ur NEGATIVE NEGATIVE  Wet prep, genital     Status: Abnormal   Collection Time: 02/22/16  8:30 PM  Result Value Ref Range   Yeast Wet Prep HPF POC NONE SEEN NONE SEEN   Trich, Wet Prep NONE SEEN NONE SEEN   Clue Cells Wet Prep HPF POC NONE SEEN NONE SEEN   WBC, Wet Prep HPF POC FEW (A) NONE SEEN   Sperm NONE SEEN   GC/Chlamydia pending Discussion about need for effective contraception- info given to pt Pt previously used Depo with weight gain   Assessment and Plan  Vaginal bleeding Safe sex recommended Contraception recommended [facility] For continued sx or contraception Adamari Frede 02/22/2016, 8:23 PM

## 2016-02-23 LAB — GC/CHLAMYDIA PROBE AMP (~~LOC~~) NOT AT ARMC
CHLAMYDIA, DNA PROBE: NEGATIVE
NEISSERIA GONORRHEA: NEGATIVE

## 2016-04-24 ENCOUNTER — Emergency Department (HOSPITAL_COMMUNITY): Payer: Medicaid Other

## 2016-04-24 ENCOUNTER — Emergency Department (HOSPITAL_COMMUNITY)
Admission: EM | Admit: 2016-04-24 | Discharge: 2016-04-25 | Disposition: A | Discharge status: Home / Self Care | Payer: Medicaid Other | Attending: Emergency Medicine | Admitting: Emergency Medicine

## 2016-04-24 ENCOUNTER — Encounter (HOSPITAL_COMMUNITY): Payer: Self-pay | Admitting: *Deleted

## 2016-04-24 DIAGNOSIS — R0602 Shortness of breath: Secondary | ICD-10-CM | POA: Insufficient documentation

## 2016-04-24 DIAGNOSIS — R0789 Other chest pain: Secondary | ICD-10-CM

## 2016-04-24 DIAGNOSIS — N390 Urinary tract infection, site not specified: Secondary | ICD-10-CM

## 2016-04-24 DIAGNOSIS — F17210 Nicotine dependence, cigarettes, uncomplicated: Secondary | ICD-10-CM | POA: Insufficient documentation

## 2016-04-24 LAB — URINE MICROSCOPIC-ADD ON

## 2016-04-24 LAB — COMPREHENSIVE METABOLIC PANEL
ALT: 24 U/L (ref 14–54)
AST: 20 U/L (ref 15–41)
Albumin: 4.1 g/dL (ref 3.5–5.0)
Alkaline Phosphatase: 65 U/L (ref 38–126)
Anion gap: 10 (ref 5–15)
BUN: 8 mg/dL (ref 6–20)
CHLORIDE: 103 mmol/L (ref 101–111)
CO2: 23 mmol/L (ref 22–32)
CREATININE: 0.72 mg/dL (ref 0.44–1.00)
Calcium: 9.1 mg/dL (ref 8.9–10.3)
GFR calc non Af Amer: >60 mL/min (ref >60)
Glucose, Bld: 105 mg/dL — ABNORMAL HIGH (ref 65–99)
POTASSIUM: 3.8 mmol/L (ref 3.5–5.1)
SODIUM: 136 mmol/L (ref 135–145)
Total Bilirubin: 1 mg/dL (ref 0.3–1.2)
Total Protein: 8.5 g/dL — ABNORMAL HIGH (ref 6.5–8.1)

## 2016-04-24 LAB — URINALYSIS, ROUTINE W REFLEX MICROSCOPIC
GLUCOSE, UA: NEGATIVE mg/dL
Ketones, ur: NEGATIVE mg/dL
Nitrite: POSITIVE — AB
PH: 5.5 (ref 5.0–8.0)
Protein, ur: 30 mg/dL — AB
SPECIFIC GRAVITY, URINE: 1.022 (ref 1.005–1.030)

## 2016-04-24 LAB — LIPASE, BLOOD: Lipase: 17 U/L (ref 11–51)

## 2016-04-24 LAB — CBC
HEMATOCRIT: 37.9 % (ref 36.0–46.0)
HEMOGLOBIN: 12.4 g/dL (ref 12.0–15.0)
MCH: 27.4 pg (ref 26.0–34.0)
MCHC: 32.7 g/dL (ref 30.0–36.0)
MCV: 83.8 fL (ref 78.0–100.0)
Platelets: 297 10*3/uL (ref 150–400)
RBC: 4.52 MIL/uL (ref 3.87–5.11)
RDW: 13.7 % (ref 11.5–15.5)
WBC: 10 10*3/uL (ref 4.0–10.5)

## 2016-04-24 LAB — D-DIMER, QUANTITATIVE: D-Dimer, Quant: 0.41 ug/mL-FEU (ref 0.00–0.50)

## 2016-04-24 MED ORDER — CEPHALEXIN 500 MG PO CAPS: 500.0000 mg | ORAL_CAPSULE | Freq: Four times a day (QID) | ORAL | 0 refills | Status: DC

## 2016-04-24 MED ORDER — IBUPROFEN 800 MG PO TABS
800.0000 mg | ORAL_TABLET | Freq: Once | ORAL | Status: AC
  Administered 2016-04-24: 800 mg via ORAL
  Filled 2016-04-24: qty 1

## 2016-04-24 MED ORDER — DIAZEPAM 5 MG PO TABS
5.0000 mg | ORAL_TABLET | Freq: Once | ORAL | Status: AC
  Administered 2016-04-24: 5 mg via ORAL
  Filled 2016-04-24: qty 1

## 2016-04-24 MED ORDER — CEFTRIAXONE SODIUM 1 G IJ SOLR
1.0000 g | Freq: Once | INTRAMUSCULAR | Status: AC
  Administered 2016-04-24: 1 g via INTRAMUSCULAR
  Filled 2016-04-24: qty 10

## 2016-04-24 MED ORDER — DIAZEPAM 2 MG PO TABS: 2.0000 mg | ORAL_TABLET | Freq: Four times a day (QID) | ORAL | 0 refills | Status: DC | PRN

## 2016-04-24 MED ORDER — LIDOCAINE HCL 1 % IJ SOLN
INTRAMUSCULAR | Status: AC
Start: 2016-04-24 — End: 2016-04-24
  Administered 2016-04-24: 20 mL
  Filled 2016-04-24: qty 20

## 2016-04-24 MED ORDER — IBUPROFEN 600 MG PO TABS: 600.0000 mg | ORAL_TABLET | Freq: Four times a day (QID) | ORAL | 0 refills | Status: DC | PRN
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
## 2016-04-24 NOTE — ED Notes (Signed)
Pt reports left-sided abdominal pain. Reports pain 10/10 that is worsened with movement and deep breathing.

## 2016-04-24 NOTE — ED Triage Notes (Signed)
Pt complains of left lower back pain radiating to her abdomen since Friday. Pt states her pain became worse after eating food a a festival over the weekend. Pt denies vomiting/diarrhea. Pt denies urinary symptoms.

## 2017-05-09 ENCOUNTER — Emergency Department (HOSPITAL_COMMUNITY): Payer: Self-pay

## 2017-05-09 ENCOUNTER — Encounter (HOSPITAL_COMMUNITY): Payer: Self-pay | Admitting: Emergency Medicine

## 2017-05-09 ENCOUNTER — Emergency Department (HOSPITAL_COMMUNITY)
Admission: EM | Admit: 2017-05-09 | Discharge: 2017-05-10 | Disposition: A | Discharge status: Home / Self Care | Payer: Self-pay | Attending: Emergency Medicine | Admitting: Emergency Medicine

## 2017-05-09 DIAGNOSIS — R42 Dizziness and giddiness: Secondary | ICD-10-CM | POA: Insufficient documentation

## 2017-05-09 DIAGNOSIS — R51 Headache: Secondary | ICD-10-CM | POA: Insufficient documentation

## 2017-05-09 DIAGNOSIS — Z5321 Procedure and treatment not carried out due to patient leaving prior to being seen by health care provider: Secondary | ICD-10-CM | POA: Insufficient documentation

## 2017-05-09 LAB — BASIC METABOLIC PANEL
ANION GAP: 9 (ref 5–15)
BUN: 9 mg/dL (ref 6–20)
CHLORIDE: 105 mmol/L (ref 101–111)
CO2: 23 mmol/L (ref 22–32)
Calcium: 8.8 mg/dL — ABNORMAL LOW (ref 8.9–10.3)
Creatinine, Ser: 0.57 mg/dL (ref 0.44–1.00)
Glucose, Bld: 89 mg/dL (ref 65–99)
POTASSIUM: 3.7 mmol/L (ref 3.5–5.1)
SODIUM: 137 mmol/L (ref 135–145)

## 2017-05-09 LAB — CBC
HEMATOCRIT: 36.6 % (ref 36.0–46.0)
HEMOGLOBIN: 12.1 g/dL (ref 12.0–15.0)
MCH: 27.8 pg (ref 26.0–34.0)
MCHC: 33.1 g/dL (ref 30.0–36.0)
MCV: 84.1 fL (ref 78.0–100.0)
Platelets: 318 10*3/uL (ref 150–400)
RBC: 4.35 MIL/uL (ref 3.87–5.11)
RDW: 13.7 % (ref 11.5–15.5)
WBC: 8.2 10*3/uL (ref 4.0–10.5)

## 2017-05-09 LAB — I-STAT TROPONIN, ED: TROPONIN I, POC: 0 ng/mL (ref 0.00–0.08)

## 2017-05-09 LAB — I-STAT BETA HCG BLOOD, ED (MC, WL, AP ONLY)

## 2017-05-09 NOTE — ED Notes (Signed)
Called for a room assignment with no response

## 2017-05-09 NOTE — ED Triage Notes (Signed)
Pt complaint of headache and dizziness with standing for 1.5 weeks; new generalized chest sharpness radiating to left chest; left chest pain described as sharp; pain with walking/lifting. Pt denies n/v/d, cough/SOB, or GU symptoms.

## 2017-05-09 NOTE — ED Notes (Signed)
Pt called from lobby with no response x2 

## 2017-12-21 ENCOUNTER — Encounter (HOSPITAL_COMMUNITY): Payer: Self-pay | Admitting: Emergency Medicine

## 2017-12-21 ENCOUNTER — Emergency Department (HOSPITAL_COMMUNITY)
Admission: EM | Admit: 2017-12-21 | Discharge: 2017-12-21 | Disposition: A | Discharge status: Home / Self Care | Payer: 59 | Attending: Emergency Medicine | Admitting: Emergency Medicine

## 2017-12-21 DIAGNOSIS — F17210 Nicotine dependence, cigarettes, uncomplicated: Secondary | ICD-10-CM | POA: Insufficient documentation

## 2017-12-21 DIAGNOSIS — J029 Acute pharyngitis, unspecified: Secondary | ICD-10-CM | POA: Diagnosis present

## 2017-12-21 DIAGNOSIS — J020 Streptococcal pharyngitis: Secondary | ICD-10-CM

## 2017-12-21 DIAGNOSIS — Z91040 Latex allergy status: Secondary | ICD-10-CM | POA: Insufficient documentation

## 2017-12-21 DIAGNOSIS — Z79899 Other long term (current) drug therapy: Secondary | ICD-10-CM | POA: Insufficient documentation

## 2017-12-21 LAB — GROUP A STREP BY PCR: Group A Strep by PCR: DETECTED — AB

## 2017-12-21 MED ORDER — DEXAMETHASONE SODIUM PHOSPHATE 10 MG/ML IJ SOLN
10.0000 mg | Freq: Once | INTRAMUSCULAR | Status: AC
  Administered 2017-12-21: 10 mg via INTRAMUSCULAR
  Filled 2017-12-21: qty 1

## 2017-12-21 MED ORDER — PENICILLIN G BENZATHINE 1200000 UNIT/2ML IM SUSP
1.2000 10*6.[IU] | Freq: Once | INTRAMUSCULAR | Status: AC
  Administered 2017-12-21: 1.2 10*6.[IU] via INTRAMUSCULAR
  Filled 2017-12-21: qty 2

## 2017-12-21 NOTE — ED Triage Notes (Signed)
Pt reports that she having sore throat since Wed. Repots that she choked on water couple days ago and was coughing and made it worse. Pt reports white spots on her tonsils.

## 2017-12-21 NOTE — Discharge Instructions (Addendum)
You were seen in the emergency department and diagnosed with strep throat.  You were given a shot of Penicillin and a shot of Decadron.  - Penicillin is an antibiotic used to treat the infection - Decadron is a steroid used to treat the pain and swelling of your throat.   You should gradually feel better over the next few days. Take Tylenol and Ibuprofen for fever and pain. Follow up with your primary care provider in the next 1 week if you are not feeling better. Return to the emergency department for any new or worsening symptoms including but not limited to inability to open your mouth, inability to move your neck, worsening pain, change in your voice, inability to swallow your own saliva, drooling, or any other concerns.

## 2017-12-21 NOTE — ED Notes (Signed)
Bed: WTR6 Expected date:  Expected time:  Means of arrival:  Comments: 
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
## 2017-12-22 ENCOUNTER — Telehealth: Payer: Self-pay

## 2017-12-22 NOTE — Telephone Encounter (Signed)
Post ED Visit - Positive Culture Follow-up  Culture report reviewed by antimicrobial stewardship pharmacist:   Enzo Bi, Pharm.D.  Celedonio Miyamoto, Pharm.D., BCPS AQ-ID  Garvin Fila, Pharm.D., BCPS  Georgina Pillion, Pharm.D., BCPS  [place], [address].D., BCPS, AAHIVP  Estella Husk, Pharm.D., BCPS, AAHIVP  Lysle Pearl, PharmD, BCPS  Sherlynn Carbon, PharmD  Pollyann Samples, PharmD, BCPS  Positive strep culture Treated with Bicillin, organism sensitive to the same and no further patient follow-up is required at this time.  Jerry Caras 12/22/2017, 10:20 AM

## 2018-03-25 ENCOUNTER — Encounter (HOSPITAL_COMMUNITY): Payer: Self-pay | Admitting: Emergency Medicine

## 2018-03-25 ENCOUNTER — Emergency Department (HOSPITAL_COMMUNITY)
Admission: EM | Admit: 2018-03-25 | Discharge: 2018-03-25 | Disposition: A | Discharge status: Home / Self Care | Payer: 59 | Attending: Emergency Medicine | Admitting: Emergency Medicine

## 2018-03-25 DIAGNOSIS — N760 Acute vaginitis: Secondary | ICD-10-CM | POA: Insufficient documentation

## 2018-03-25 DIAGNOSIS — F17290 Nicotine dependence, other tobacco product, uncomplicated: Secondary | ICD-10-CM | POA: Insufficient documentation

## 2018-03-25 DIAGNOSIS — B9689 Other specified bacterial agents as the cause of diseases classified elsewhere: Secondary | ICD-10-CM

## 2018-03-25 DIAGNOSIS — N6452 Nipple discharge: Secondary | ICD-10-CM | POA: Insufficient documentation

## 2018-03-25 DIAGNOSIS — Z91040 Latex allergy status: Secondary | ICD-10-CM | POA: Insufficient documentation

## 2018-03-25 LAB — URINALYSIS, ROUTINE W REFLEX MICROSCOPIC
Bilirubin Urine: NEGATIVE
Glucose, UA: NEGATIVE mg/dL
Hgb urine dipstick: NEGATIVE
KETONES UR: NEGATIVE mg/dL
Leukocytes, UA: NEGATIVE
NITRITE: NEGATIVE
PROTEIN: NEGATIVE mg/dL
Specific Gravity, Urine: 1.023 (ref 1.005–1.030)
pH: 5 (ref 5.0–8.0)

## 2018-03-25 LAB — WET PREP, GENITAL
Sperm: NONE SEEN
TRICH WET PREP: NONE SEEN
Yeast Wet Prep HPF POC: NONE SEEN

## 2018-03-25 LAB — POC URINE PREG, ED: Preg Test, Ur: NEGATIVE

## 2018-03-25 MED ORDER — METRONIDAZOLE 500 MG PO TABS: 500.0000 mg | ORAL_TABLET | Freq: Two times a day (BID) | ORAL | 0 refills | Status: DC
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
## 2018-03-25 NOTE — Discharge Instructions (Signed)
Talk to your family doctor about referral for mammography regarding your nipple discharge.

## 2018-03-25 NOTE — ED Triage Notes (Signed)
Pt c/o vaginal odor and itching. Reports took medications prescribed for yeast infection and the discharge cleared. Saw PCP last month when sexually active and was tested but wants to make sure again. Also c/o drainage yellow-greenish from right nipple piercing.

## 2018-03-26 LAB — GC/CHLAMYDIA PROBE AMP (~~LOC~~) NOT AT ARMC
CHLAMYDIA, DNA PROBE: NEGATIVE
NEISSERIA GONORRHEA: NEGATIVE

## 2018-03-26 LAB — HIV ANTIBODY (ROUTINE TESTING W REFLEX): HIV Screen 4th Generation wRfx: NONREACTIVE

## 2018-03-26 LAB — RPR: RPR: NONREACTIVE

## 2019-05-04 ENCOUNTER — Encounter (HOSPITAL_COMMUNITY): Payer: Self-pay | Admitting: Emergency Medicine

## 2019-05-04 ENCOUNTER — Other Ambulatory Visit: Payer: Self-pay | Admitting: Emergency Medicine

## 2019-05-04 ENCOUNTER — Other Ambulatory Visit: Payer: Self-pay

## 2019-05-04 ENCOUNTER — Emergency Department (HOSPITAL_COMMUNITY)
Admission: EM | Admit: 2019-05-04 | Discharge: 2019-05-04 | Disposition: A | Discharge status: Home / Self Care | Payer: 59 | Attending: Emergency Medicine | Admitting: Emergency Medicine

## 2019-05-04 DIAGNOSIS — N939 Abnormal uterine and vaginal bleeding, unspecified: Secondary | ICD-10-CM

## 2019-05-04 DIAGNOSIS — B9689 Other specified bacterial agents as the cause of diseases classified elsewhere: Secondary | ICD-10-CM

## 2019-05-04 DIAGNOSIS — N760 Acute vaginitis: Secondary | ICD-10-CM | POA: Insufficient documentation

## 2019-05-04 DIAGNOSIS — F17290 Nicotine dependence, other tobacco product, uncomplicated: Secondary | ICD-10-CM | POA: Insufficient documentation

## 2019-05-04 DIAGNOSIS — Z113 Encounter for screening for infections with a predominantly sexual mode of transmission: Secondary | ICD-10-CM | POA: Insufficient documentation

## 2019-05-04 DIAGNOSIS — Z91040 Latex allergy status: Secondary | ICD-10-CM | POA: Insufficient documentation

## 2019-05-04 LAB — I-STAT CHEM 8, ED
BUN: 7 mg/dL (ref 6–20)
Calcium, Ion: 1.13 mmol/L — ABNORMAL LOW (ref 1.15–1.40)
Chloride: 104 mmol/L (ref 98–111)
Creatinine, Ser: 0.5 mg/dL (ref 0.44–1.00)
Glucose, Bld: 77 mg/dL (ref 70–99)
HCT: 44 % (ref 36.0–46.0)
Hemoglobin: 15 g/dL (ref 12.0–15.0)
Potassium: 3.9 mmol/L (ref 3.5–5.1)
Sodium: 139 mmol/L (ref 135–145)
TCO2: 24 mmol/L (ref 22–32)

## 2019-05-04 LAB — URINALYSIS, ROUTINE W REFLEX MICROSCOPIC
Bacteria, UA: NONE SEEN
Bilirubin Urine: NEGATIVE
Glucose, UA: NEGATIVE mg/dL
Ketones, ur: NEGATIVE mg/dL
Leukocytes,Ua: NEGATIVE
Nitrite: NEGATIVE
Protein, ur: NEGATIVE mg/dL
Specific Gravity, Urine: 1.021 (ref 1.005–1.030)
pH: 5 (ref 5.0–8.0)

## 2019-05-04 LAB — WET PREP, GENITAL
Sperm: NONE SEEN
Trich, Wet Prep: NONE SEEN
Yeast Wet Prep HPF POC: NONE SEEN

## 2019-05-04 LAB — I-STAT BETA HCG BLOOD, ED (MC, WL, AP ONLY): I-stat hCG, quantitative: <5 m[IU]/mL (ref <5)

## 2019-05-04 MED ORDER — METRONIDAZOLE 500 MG PO TABS: 500.0000 mg | ORAL_TABLET | Freq: Two times a day (BID) | ORAL | 0 refills | Status: DC

## 2019-05-04 NOTE — ED Notes (Signed)
Pt going to car to smoke pt stated she would be back in a few minutes.

## 2019-05-04 NOTE — ED Notes (Signed)
Patient verbalizes understanding of discharge instructions. Opportunity for questioning and answers were provided. Armband removed by staff, pt discharged from ED.  

## 2019-05-04 NOTE — ED Notes (Signed)
Pt upset due to not seeing a provider or RN since being in room. This NT apologized for the delay. RN and PA made aware.

## 2019-05-04 NOTE — Discharge Instructions (Signed)
There was evidence of possible bacterial vaginosis. Please take all of your antibiotics until finished!   You may develop abdominal discomfort or diarrhea from the antibiotic.  You may help offset this with probiotics which you can buy or get in yogurt. Do not eat or take the probiotics until 2 hours after your antibiotic.  Avoid alcohol with the metronidazole.  Follow-up with OB/GYN for any further management of the vaginal bleeding.
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
## 2019-05-04 NOTE — ED Triage Notes (Signed)
Pt c/o intermittent vaginal after intercourse Tuesday. Denies vaginal pain or abnormal discharge. Also concerned it could be related to dietary changes.

## 2019-05-05 LAB — RPR: RPR Ser Ql: NONREACTIVE

## 2019-05-05 LAB — HIV ANTIBODY (ROUTINE TESTING W REFLEX): HIV Screen 4th Generation wRfx: NONREACTIVE

## 2019-05-06 ENCOUNTER — Encounter (HOSPITAL_COMMUNITY): Payer: Self-pay | Admitting: Emergency Medicine

## 2019-05-06 ENCOUNTER — Emergency Department (HOSPITAL_COMMUNITY)
Admission: EM | Admit: 2019-05-06 | Discharge: 2019-05-06 | Disposition: A | Discharge status: Home / Self Care | Payer: No Typology Code available for payment source | Attending: Emergency Medicine | Admitting: Emergency Medicine

## 2019-05-06 ENCOUNTER — Emergency Department (HOSPITAL_COMMUNITY): Payer: No Typology Code available for payment source

## 2019-05-06 ENCOUNTER — Other Ambulatory Visit: Payer: Self-pay

## 2019-05-06 DIAGNOSIS — F17210 Nicotine dependence, cigarettes, uncomplicated: Secondary | ICD-10-CM | POA: Insufficient documentation

## 2019-05-06 DIAGNOSIS — S161XXA Strain of muscle, fascia and tendon at neck level, initial encounter: Secondary | ICD-10-CM | POA: Insufficient documentation

## 2019-05-06 DIAGNOSIS — Y999 Unspecified external cause status: Secondary | ICD-10-CM | POA: Insufficient documentation

## 2019-05-06 DIAGNOSIS — Y92481 Parking lot as the place of occurrence of the external cause: Secondary | ICD-10-CM | POA: Insufficient documentation

## 2019-05-06 DIAGNOSIS — Y93I9 Activity, other involving external motion: Secondary | ICD-10-CM | POA: Diagnosis not present

## 2019-05-06 DIAGNOSIS — Z91040 Latex allergy status: Secondary | ICD-10-CM | POA: Diagnosis not present

## 2019-05-06 DIAGNOSIS — S199XXA Unspecified injury of neck, initial encounter: Secondary | ICD-10-CM | POA: Diagnosis present

## 2019-05-06 DIAGNOSIS — R51 Headache: Secondary | ICD-10-CM | POA: Diagnosis not present

## 2019-05-06 LAB — CERVICOVAGINAL ANCILLARY ONLY
Chlamydia: NEGATIVE
Neisseria Gonorrhea: NEGATIVE

## 2019-05-06 MED ORDER — CYCLOBENZAPRINE HCL 10 MG PO TABS: 10.0000 mg | ORAL_TABLET | Freq: Two times a day (BID) | ORAL | 0 refills | Status: DC | PRN

## 2019-05-06 MED ORDER — METOCLOPRAMIDE HCL 10 MG PO TABS
10.0000 mg | ORAL_TABLET | Freq: Once | ORAL | Status: AC
  Administered 2019-05-06: 13:00:00 10 mg via ORAL
  Filled 2019-05-06: qty 1

## 2019-05-06 MED ORDER — KETOROLAC TROMETHAMINE 30 MG/ML IJ SOLN
30.0000 mg | Freq: Once | INTRAMUSCULAR | Status: AC
  Administered 2019-05-06: 30 mg via INTRAMUSCULAR
  Filled 2019-05-06: qty 1

## 2019-05-06 MED ORDER — DIPHENHYDRAMINE HCL 25 MG PO CAPS
25.0000 mg | ORAL_CAPSULE | Freq: Once | ORAL | Status: AC
  Administered 2019-05-06: 25 mg via ORAL
  Filled 2019-05-06: qty 1

## 2019-05-06 NOTE — Discharge Instructions (Signed)
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
## 2019-05-13 ENCOUNTER — Encounter (HOSPITAL_COMMUNITY): Payer: Self-pay | Admitting: Emergency Medicine

## 2019-05-13 ENCOUNTER — Other Ambulatory Visit: Payer: Self-pay

## 2019-05-13 ENCOUNTER — Emergency Department (HOSPITAL_COMMUNITY)
Admission: EM | Admit: 2019-05-13 | Discharge: 2019-05-13 | Disposition: A | Discharge status: Home / Self Care | Payer: Self-pay | Attending: Emergency Medicine | Admitting: Emergency Medicine

## 2019-05-13 DIAGNOSIS — J029 Acute pharyngitis, unspecified: Secondary | ICD-10-CM | POA: Insufficient documentation

## 2019-05-13 DIAGNOSIS — F17290 Nicotine dependence, other tobacco product, uncomplicated: Secondary | ICD-10-CM | POA: Insufficient documentation

## 2019-05-13 DIAGNOSIS — Z79899 Other long term (current) drug therapy: Secondary | ICD-10-CM | POA: Insufficient documentation

## 2019-05-13 DIAGNOSIS — Z91040 Latex allergy status: Secondary | ICD-10-CM | POA: Insufficient documentation

## 2019-05-13 DIAGNOSIS — J020 Streptococcal pharyngitis: Secondary | ICD-10-CM

## 2019-05-13 LAB — GROUP A STREP BY PCR: Group A Strep by PCR: DETECTED — AB

## 2019-05-13 MED ORDER — PENICILLIN G BENZATHINE 1200000 UNIT/2ML IM SUSP
1.2000 10*6.[IU] | Freq: Once | INTRAMUSCULAR | Status: AC
  Administered 2019-05-13: 16:00:00 1.2 10*6.[IU] via INTRAMUSCULAR
  Filled 2019-05-13: qty 2

## 2019-05-13 MED ORDER — DEXAMETHASONE SODIUM PHOSPHATE 10 MG/ML IJ SOLN
10.0000 mg | Freq: Once | INTRAMUSCULAR | Status: AC
  Administered 2019-05-13: 16:00:00 10 mg via INTRAMUSCULAR
  Filled 2019-05-13: qty 1
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
## 2019-05-13 NOTE — ED Triage Notes (Signed)
Pt c/o sore throat and patches on her tonsils for couple days. Having body aches and feeling fatigued.

## 2019-05-13 NOTE — Discharge Instructions (Addendum)
You were seen in the emergency department and diagnosed with strep throat.  You were given a shot of Penicillin and a shot of Decadron.  - Penicillin is an antibiotic used to treat the infection - Decadron is a steroid used to treat the pain and swelling of your throat.   You should gradually feel better over the next few days. Take Tylenol and Ibuprofen for fever and pain. Follow up with your primary care provider in the next 1 week if you are not feeling better, if you do not have a primary care provider one is provided in your discharge instructions. Return to the emergency department for any new or worsening symptoms including but not limited to inability to open your mouth, inability to move your neck, worsening pain, change in your voice, inability to swallow your own saliva, drooling, or any other concerns.    Your blood pressure was elevated at your ER visit today, have this rechecked at your follow-up appointment.

## 2019-07-02 ENCOUNTER — Encounter (HOSPITAL_COMMUNITY): Payer: Self-pay

## 2019-07-02 ENCOUNTER — Other Ambulatory Visit: Payer: Self-pay

## 2019-07-02 ENCOUNTER — Emergency Department (HOSPITAL_COMMUNITY)
Admission: EM | Admit: 2019-07-02 | Discharge: 2019-07-02 | Disposition: A | Discharge status: Home / Self Care | Payer: No Typology Code available for payment source | Attending: Emergency Medicine | Admitting: Emergency Medicine

## 2019-07-02 ENCOUNTER — Emergency Department (HOSPITAL_COMMUNITY): Payer: No Typology Code available for payment source

## 2019-07-02 DIAGNOSIS — Z91040 Latex allergy status: Secondary | ICD-10-CM | POA: Insufficient documentation

## 2019-07-02 DIAGNOSIS — F17210 Nicotine dependence, cigarettes, uncomplicated: Secondary | ICD-10-CM | POA: Insufficient documentation

## 2019-07-02 DIAGNOSIS — Z79899 Other long term (current) drug therapy: Secondary | ICD-10-CM | POA: Insufficient documentation

## 2019-07-02 DIAGNOSIS — M25512 Pain in left shoulder: Secondary | ICD-10-CM | POA: Insufficient documentation

## 2019-07-02 DIAGNOSIS — Y999 Unspecified external cause status: Secondary | ICD-10-CM | POA: Insufficient documentation

## 2019-07-02 DIAGNOSIS — Y92410 Unspecified street and highway as the place of occurrence of the external cause: Secondary | ICD-10-CM | POA: Diagnosis not present

## 2019-07-02 DIAGNOSIS — M25562 Pain in left knee: Secondary | ICD-10-CM | POA: Insufficient documentation

## 2019-07-02 DIAGNOSIS — Y93I9 Activity, other involving external motion: Secondary | ICD-10-CM | POA: Insufficient documentation

## 2019-07-02 MED ORDER — METHOCARBAMOL 500 MG PO TABS: 500.0000 mg | ORAL_TABLET | Freq: Two times a day (BID) | ORAL | 0 refills | Status: DC

## 2019-07-02 MED ORDER — MELOXICAM 15 MG PO TABS: 15.0000 mg | ORAL_TABLET | Freq: Every day | ORAL | 0 refills | Status: DC
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
## 2019-07-02 NOTE — Discharge Instructions (Signed)
I am sending you home with two prescriptions: Meloxicam and Robaxin. Take as prescribed. Robaxin can make you drowsy, so do not drive or operate machinery while on the medication. I have also given you a number for [facility]. If your symptoms do not improve within the next week schedule an appointment with them. Return to the ER for new or worsening symptoms.
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
## 2020-02-09 ENCOUNTER — Encounter (HOSPITAL_BASED_OUTPATIENT_CLINIC_OR_DEPARTMENT_OTHER): Payer: Self-pay | Admitting: Emergency Medicine

## 2020-02-09 ENCOUNTER — Other Ambulatory Visit: Payer: Self-pay

## 2020-02-09 ENCOUNTER — Emergency Department (HOSPITAL_BASED_OUTPATIENT_CLINIC_OR_DEPARTMENT_OTHER)
Admission: EM | Admit: 2020-02-09 | Discharge: 2020-02-09 | Disposition: A | Discharge status: Home / Self Care | Payer: 59 | Attending: Emergency Medicine | Admitting: Emergency Medicine

## 2020-02-09 ENCOUNTER — Emergency Department (HOSPITAL_BASED_OUTPATIENT_CLINIC_OR_DEPARTMENT_OTHER): Payer: 59

## 2020-02-09 DIAGNOSIS — R002 Palpitations: Secondary | ICD-10-CM | POA: Diagnosis not present

## 2020-02-09 DIAGNOSIS — R079 Chest pain, unspecified: Secondary | ICD-10-CM | POA: Diagnosis present

## 2020-02-09 DIAGNOSIS — M7918 Myalgia, other site: Secondary | ICD-10-CM | POA: Diagnosis not present

## 2020-02-09 DIAGNOSIS — F17210 Nicotine dependence, cigarettes, uncomplicated: Secondary | ICD-10-CM | POA: Diagnosis not present

## 2020-02-09 DIAGNOSIS — Z91040 Latex allergy status: Secondary | ICD-10-CM | POA: Diagnosis not present

## 2020-02-09 LAB — BASIC METABOLIC PANEL
Anion gap: 8 (ref 5–15)
BUN: 12 mg/dL (ref 6–20)
CO2: 26 mmol/L (ref 22–32)
Calcium: 8.6 mg/dL — ABNORMAL LOW (ref 8.9–10.3)
Chloride: 103 mmol/L (ref 98–111)
Creatinine, Ser: 0.7 mg/dL (ref 0.44–1.00)
GFR calc Af Amer: >60 mL/min (ref >60)
GFR calc non Af Amer: >60 mL/min (ref >60)
Glucose, Bld: 97 mg/dL (ref 70–99)
Potassium: 4 mmol/L (ref 3.5–5.1)
Sodium: 137 mmol/L (ref 135–145)

## 2020-02-09 LAB — CBC WITH DIFFERENTIAL/PLATELET
Abs Immature Granulocytes: 0.02 10*3/uL (ref 0.00–0.07)
Basophils Absolute: 0 10*3/uL (ref 0.0–0.1)
Basophils Relative: 0 %
Eosinophils Absolute: 0.2 10*3/uL (ref 0.0–0.5)
Eosinophils Relative: 2 %
HCT: 37 % (ref 36.0–46.0)
Hemoglobin: 11.7 g/dL — ABNORMAL LOW (ref 12.0–15.0)
Immature Granulocytes: 0 %
Lymphocytes Relative: 49 %
Lymphs Abs: 4.5 10*3/uL — ABNORMAL HIGH (ref 0.7–4.0)
MCH: 28.5 pg (ref 26.0–34.0)
MCHC: 31.6 g/dL (ref 30.0–36.0)
MCV: 90 fL (ref 80.0–100.0)
Monocytes Absolute: 0.8 10*3/uL (ref 0.1–1.0)
Monocytes Relative: 9 %
Neutro Abs: 3.7 10*3/uL (ref 1.7–7.7)
Neutrophils Relative %: 40 %
Platelets: 337 10*3/uL (ref 150–400)
RBC: 4.11 MIL/uL (ref 3.87–5.11)
RDW: 13.4 % (ref 11.5–15.5)
WBC: 9.1 10*3/uL (ref 4.0–10.5)
nRBC: 0 % (ref 0.0–0.2)

## 2020-02-09 MED ORDER — NAPROXEN 500 MG PO TABS: 500.0000 mg | ORAL_TABLET | Freq: Two times a day (BID) | ORAL | 0 refills | Status: DC

## 2020-02-09 MED ORDER — KETOROLAC TROMETHAMINE 15 MG/ML IJ SOLN
15.0000 mg | Freq: Once | INTRAMUSCULAR | Status: AC
  Administered 2020-02-09: 15 mg via INTRAVENOUS
  Filled 2020-02-09: qty 1

## 2020-02-09 NOTE — ED Notes (Signed)
MD with pt  

## 2020-02-09 NOTE — ED Notes (Signed)
States she feels much better after toradol. Rates pain 7/10

## 2020-02-09 NOTE — Discharge Instructions (Addendum)
You were seen today for pain in your trunk.  You also have palpitations.  Your work-up is reassuring including EKG, chest x-ray, basic lab work.  Given how reproducible your pain is, this may be musculoskeletal related.  Make sure that you are taking an anti-inflammatory such as naproxen.  Follow-up with your primary physician.

## 2020-02-09 NOTE — ED Triage Notes (Signed)
Pt reports chest pain since last week. Reports headaches and stiff neck. PVCs noted on EKG.
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
## 2020-02-26 ENCOUNTER — Encounter (HOSPITAL_BASED_OUTPATIENT_CLINIC_OR_DEPARTMENT_OTHER): Payer: Self-pay

## 2020-02-26 ENCOUNTER — Emergency Department (HOSPITAL_BASED_OUTPATIENT_CLINIC_OR_DEPARTMENT_OTHER): Payer: BLUE CROSS/BLUE SHIELD

## 2020-02-26 ENCOUNTER — Emergency Department (HOSPITAL_BASED_OUTPATIENT_CLINIC_OR_DEPARTMENT_OTHER)
Admission: EM | Admit: 2020-02-26 | Discharge: 2020-02-26 | Disposition: A | Discharge status: Home / Self Care | Payer: BLUE CROSS/BLUE SHIELD | Attending: Emergency Medicine | Admitting: Emergency Medicine

## 2020-02-26 ENCOUNTER — Other Ambulatory Visit: Payer: Self-pay

## 2020-02-26 DIAGNOSIS — M25562 Pain in left knee: Secondary | ICD-10-CM | POA: Insufficient documentation

## 2020-02-26 DIAGNOSIS — F17210 Nicotine dependence, cigarettes, uncomplicated: Secondary | ICD-10-CM | POA: Insufficient documentation

## 2020-02-26 DIAGNOSIS — Z91040 Latex allergy status: Secondary | ICD-10-CM | POA: Diagnosis not present

## 2020-02-26 DIAGNOSIS — I10 Essential (primary) hypertension: Secondary | ICD-10-CM | POA: Diagnosis not present

## 2020-02-26 NOTE — ED Triage Notes (Signed)
Pt c/o pain/ "a pop" to left knee while stretching ~7am-NAD-steady gait
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
## 2020-02-26 NOTE — Discharge Instructions (Addendum)
Your examination today is most concerning for a muscular injury 1. Medications: alternate ibuprofen and tylenol for pain control, take all usual home medications as they are prescribed  Please use Tylenol or ibuprofen for pain.  You may use 600 mg ibuprofen every 6 hours or 1000 mg of Tylenol every 6 hours.  You may choose to alternate between the 2.  This would be most effective.  Not to exceed 4 g of Tylenol within 24 hours.  Not to exceed 3200 mg ibuprofen 24 hours.   2. Treatment: rest, ice, elevate and use an ACE wrap or other compressive therapy to decrease swelling. Also drink plenty of fluids and do plenty of gentle stretching and move the affected muscle through its normal range of motion to prevent stiffness. 3. Follow Up: If your symptoms do not improve please follow up with orthopedics/sports medicine or your PCP for discussion of your diagnoses and further evaluation after today's visit; if you do not have a primary care doctor use the resource guide provided to find one; Please return to the ER for worsening symptoms or other concerns.   

## 2020-02-27 ENCOUNTER — Emergency Department (HOSPITAL_COMMUNITY): Payer: BLUE CROSS/BLUE SHIELD

## 2020-02-27 ENCOUNTER — Other Ambulatory Visit: Payer: Self-pay

## 2020-02-27 ENCOUNTER — Emergency Department (HOSPITAL_COMMUNITY)
Admission: EM | Admit: 2020-02-27 | Discharge: 2020-02-28 | Disposition: A | Discharge status: Home / Self Care | Payer: BLUE CROSS/BLUE SHIELD | Attending: Emergency Medicine | Admitting: Emergency Medicine

## 2020-02-27 DIAGNOSIS — S8992XA Unspecified injury of left lower leg, initial encounter: Secondary | ICD-10-CM

## 2020-02-27 DIAGNOSIS — Y93B9 Activity, other involving muscle strengthening exercises: Secondary | ICD-10-CM | POA: Diagnosis not present

## 2020-02-27 DIAGNOSIS — Z91040 Latex allergy status: Secondary | ICD-10-CM | POA: Insufficient documentation

## 2020-02-27 DIAGNOSIS — F17210 Nicotine dependence, cigarettes, uncomplicated: Secondary | ICD-10-CM | POA: Diagnosis not present

## 2020-02-27 DIAGNOSIS — Y929 Unspecified place or not applicable: Secondary | ICD-10-CM | POA: Insufficient documentation

## 2020-02-27 DIAGNOSIS — S80912A Unspecified superficial injury of left knee, initial encounter: Secondary | ICD-10-CM | POA: Diagnosis not present

## 2020-02-27 DIAGNOSIS — Y999 Unspecified external cause status: Secondary | ICD-10-CM | POA: Diagnosis not present

## 2020-02-27 DIAGNOSIS — G43809 Other migraine, not intractable, without status migrainosus: Secondary | ICD-10-CM | POA: Insufficient documentation

## 2020-02-27 DIAGNOSIS — X500XXA Overexertion from strenuous movement or load, initial encounter: Secondary | ICD-10-CM | POA: Diagnosis not present

## 2020-02-27 NOTE — ED Notes (Signed)
Pt went to car

## 2020-02-27 NOTE — ED Triage Notes (Addendum)
Pt c/o numbness to left side of body that began 21:00 this evening (from the left shoulder down). Also c/o nausea. States that she injured her leg yesterday, and was seen at [facility]. Pt thinks that her leg injury is causing left side numbness . States left arm still feels weak. NIH is 0. Pt states that she has. been working out and lifting weights x weeks.

## 2020-02-28 ENCOUNTER — Emergency Department (HOSPITAL_BASED_OUTPATIENT_CLINIC_OR_DEPARTMENT_OTHER): Payer: BLUE CROSS/BLUE SHIELD

## 2020-02-28 ENCOUNTER — Emergency Department (HOSPITAL_COMMUNITY): Payer: BLUE CROSS/BLUE SHIELD

## 2020-02-28 DIAGNOSIS — M79609 Pain in unspecified limb: Secondary | ICD-10-CM | POA: Diagnosis not present

## 2020-02-28 LAB — COMPREHENSIVE METABOLIC PANEL
ALT: 33 U/L (ref 0–44)
AST: 32 U/L (ref 15–41)
Albumin: 3.8 g/dL (ref 3.5–5.0)
Alkaline Phosphatase: 52 U/L (ref 38–126)
Anion gap: 9 (ref 5–15)
BUN: 9 mg/dL (ref 6–20)
CO2: 24 mmol/L (ref 22–32)
Calcium: 8.6 mg/dL — ABNORMAL LOW (ref 8.9–10.3)
Chloride: 103 mmol/L (ref 98–111)
Creatinine, Ser: 0.86 mg/dL (ref 0.44–1.00)
GFR calc Af Amer: >60 mL/min (ref >60)
GFR calc non Af Amer: >60 mL/min (ref >60)
Glucose, Bld: 114 mg/dL — ABNORMAL HIGH (ref 70–99)
Potassium: 3.7 mmol/L (ref 3.5–5.1)
Sodium: 136 mmol/L (ref 135–145)
Total Bilirubin: 0.4 mg/dL (ref 0.3–1.2)
Total Protein: 6.9 g/dL (ref 6.5–8.1)

## 2020-02-28 LAB — DIFFERENTIAL
Abs Immature Granulocytes: 0.02 10*3/uL (ref 0.00–0.07)
Basophils Absolute: 0 10*3/uL (ref 0.0–0.1)
Basophils Relative: 0 %
Eosinophils Absolute: 0.2 10*3/uL (ref 0.0–0.5)
Eosinophils Relative: 2 %
Immature Granulocytes: 0 %
Lymphocytes Relative: 56 %
Lymphs Abs: 4 10*3/uL (ref 0.7–4.0)
Monocytes Absolute: 0.5 10*3/uL (ref 0.1–1.0)
Monocytes Relative: 7 %
Neutro Abs: 2.5 10*3/uL (ref 1.7–7.7)
Neutrophils Relative %: 35 %

## 2020-02-28 LAB — I-STAT BETA HCG BLOOD, ED (MC, WL, AP ONLY): I-stat hCG, quantitative: <5 m[IU]/mL (ref <5)

## 2020-02-28 LAB — I-STAT CHEM 8, ED
BUN: 11 mg/dL (ref 6–20)
Calcium, Ion: 1.17 mmol/L (ref 1.15–1.40)
Chloride: 102 mmol/L (ref 98–111)
Creatinine, Ser: 0.8 mg/dL (ref 0.44–1.00)
Glucose, Bld: 114 mg/dL — ABNORMAL HIGH (ref 70–99)
HCT: 37 % (ref 36.0–46.0)
Hemoglobin: 12.6 g/dL (ref 12.0–15.0)
Potassium: 3.7 mmol/L (ref 3.5–5.1)
Sodium: 139 mmol/L (ref 135–145)
TCO2: 26 mmol/L (ref 22–32)

## 2020-02-28 LAB — PROTIME-INR
INR: 1 (ref 0.8–1.2)
Prothrombin Time: 13.1 seconds (ref 11.4–15.2)

## 2020-02-28 LAB — CBC
HCT: 38.9 % (ref 36.0–46.0)
Hemoglobin: 12.4 g/dL (ref 12.0–15.0)
MCH: 28.6 pg (ref 26.0–34.0)
MCHC: 31.9 g/dL (ref 30.0–36.0)
MCV: 89.6 fL (ref 80.0–100.0)
Platelets: 331 10*3/uL (ref 150–400)
RBC: 4.34 MIL/uL (ref 3.87–5.11)
RDW: 13.7 % (ref 11.5–15.5)
WBC: 7.4 10*3/uL (ref 4.0–10.5)
nRBC: 0 % (ref 0.0–0.2)

## 2020-02-28 LAB — APTT: aPTT: 35 seconds (ref 24–36)

## 2020-02-28 MED ORDER — KETOROLAC TROMETHAMINE 15 MG/ML IJ SOLN
15.0000 mg | Freq: Once | INTRAMUSCULAR | Status: AC
  Administered 2020-02-28: 15 mg via INTRAVENOUS
  Filled 2020-02-28: qty 1

## 2020-02-28 MED ORDER — PROCHLORPERAZINE EDISYLATE 10 MG/2ML IJ SOLN
5.0000 mg | Freq: Once | INTRAMUSCULAR | Status: AC
  Administered 2020-02-28: 5 mg via INTRAMUSCULAR
  Filled 2020-02-28: qty 2

## 2020-02-28 MED ORDER — SODIUM CHLORIDE 0.9 % IV BOLUS
500.0000 mL | Freq: Once | INTRAVENOUS | Status: AC
  Administered 2020-02-28: 500 mL via INTRAVENOUS

## 2020-02-28 MED ORDER — DIPHENHYDRAMINE HCL 50 MG/ML IJ SOLN
12.5000 mg | Freq: Once | INTRAMUSCULAR | Status: AC
  Administered 2020-02-28: 12.5 mg via INTRAVENOUS
  Filled 2020-02-28: qty 1

## 2020-02-28 MED ORDER — CELECOXIB 200 MG PO CAPS
200.0000 mg | ORAL_CAPSULE | Freq: Two times a day (BID) | ORAL | 0 refills | Status: DC
Start: 2020-02-28 — End: 2022-02-22

## 2020-02-28 NOTE — Progress Notes (Signed)
Orthopedic Tech Progress Note Patient Details:  Alicia Morgan 1990-10-18 [id number]  Ortho Devices Type of Ortho Device: Crutches Ortho Device/Splint Interventions: Ordered, Adjustment   Post Interventions Instructions Provided: Adjustment of device, Care of device, Poper ambulation with device   Gerald Stabs 02/28/2020, 10:56 AM

## 2020-02-28 NOTE — Progress Notes (Signed)
VASCULAR LAB PRELIMINARY  PRELIMINARY  PRELIMINARY  PRELIMINARY  Left lower extremity venous duplex completed.    Preliminary report:  See CV proc for preliminary results.  Called report to Arthor Captain, PA-C  [facility], Elzora Cullins, RVT 02/28/2020, 8:01 AM
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
## 2020-02-28 NOTE — ED Notes (Signed)
Patient verbalizes understanding of discharge instructions. Opportunity for questioning and answers were provided. Armband removed by staff, pt discharged from ED.  

## 2020-02-28 NOTE — ED Notes (Signed)
Pt transported to Vascular 

## 2020-02-28 NOTE — Discharge Instructions (Addendum)
Contact a health care provider if:  Your knee pain continues, changes, or gets worse.  You have a fever along with knee pain.  Your knee feels warm to the touch.  Your knee buckles or locks up.  Get help right away if:  Your knee swells, and the swelling becomes worse.  You cannot move your knee.  You have severe pain in your knee.

## 2020-03-05 ENCOUNTER — Telehealth: Payer: Self-pay | Admitting: Surgical

## 2020-03-05 ENCOUNTER — Ambulatory Visit: Payer: Self-pay

## 2020-03-05 ENCOUNTER — Ambulatory Visit (INDEPENDENT_AMBULATORY_CARE_PROVIDER_SITE_OTHER): Payer: 59 | Admitting: Surgical

## 2020-03-05 DIAGNOSIS — M25572 Pain in left ankle and joints of left foot: Secondary | ICD-10-CM

## 2020-03-05 DIAGNOSIS — S83002A Unspecified subluxation of left patella, initial encounter: Secondary | ICD-10-CM | POA: Diagnosis not present

## 2020-03-05 MED ORDER — MELOXICAM 15 MG PO TABS
15.0000 mg | ORAL_TABLET | Freq: Every day | ORAL | 0 refills | Status: DC
Start: 2020-03-05 — End: 2022-02-22

## 2020-03-05 NOTE — Telephone Encounter (Signed)
IC patient advised this was submitted by Franky Macho earlier this afternoon.

## 2020-03-05 NOTE — Telephone Encounter (Signed)
Patient called  She is waiting on a prescription that was supposed to be called in after her appointment also wanted to know the name  Call back: [phone].
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
## 2020-03-16 ENCOUNTER — Emergency Department (HOSPITAL_BASED_OUTPATIENT_CLINIC_OR_DEPARTMENT_OTHER)
Admission: EM | Admit: 2020-03-16 | Discharge: 2020-03-16 | Disposition: A | Discharge status: Home / Self Care | Payer: 59 | Attending: Emergency Medicine | Admitting: Emergency Medicine

## 2020-03-16 ENCOUNTER — Other Ambulatory Visit: Payer: Self-pay

## 2020-03-16 ENCOUNTER — Encounter (HOSPITAL_BASED_OUTPATIENT_CLINIC_OR_DEPARTMENT_OTHER): Payer: Self-pay | Admitting: *Deleted

## 2020-03-16 DIAGNOSIS — Z91040 Latex allergy status: Secondary | ICD-10-CM | POA: Diagnosis not present

## 2020-03-16 DIAGNOSIS — F17210 Nicotine dependence, cigarettes, uncomplicated: Secondary | ICD-10-CM | POA: Diagnosis not present

## 2020-03-16 DIAGNOSIS — B9689 Other specified bacterial agents as the cause of diseases classified elsewhere: Secondary | ICD-10-CM

## 2020-03-16 DIAGNOSIS — I10 Essential (primary) hypertension: Secondary | ICD-10-CM | POA: Diagnosis not present

## 2020-03-16 DIAGNOSIS — N898 Other specified noninflammatory disorders of vagina: Secondary | ICD-10-CM

## 2020-03-16 DIAGNOSIS — E669 Obesity, unspecified: Secondary | ICD-10-CM | POA: Diagnosis not present

## 2020-03-16 DIAGNOSIS — N760 Acute vaginitis: Secondary | ICD-10-CM | POA: Insufficient documentation

## 2020-03-16 LAB — URINALYSIS, ROUTINE W REFLEX MICROSCOPIC
Bilirubin Urine: NEGATIVE
Glucose, UA: NEGATIVE mg/dL
Hgb urine dipstick: NEGATIVE
Ketones, ur: NEGATIVE mg/dL
Leukocytes,Ua: NEGATIVE
Nitrite: NEGATIVE
Protein, ur: NEGATIVE mg/dL
Specific Gravity, Urine: >1.03 — ABNORMAL HIGH (ref 1.005–1.030)
pH: 6 (ref 5.0–8.0)

## 2020-03-16 LAB — WET PREP, GENITAL
Sperm: NONE SEEN
Trich, Wet Prep: NONE SEEN
Yeast Wet Prep HPF POC: NONE SEEN

## 2020-03-16 LAB — PREGNANCY, URINE: Preg Test, Ur: NEGATIVE

## 2020-03-16 MED ORDER — METRONIDAZOLE 500 MG PO TABS
500.0000 mg | ORAL_TABLET | Freq: Two times a day (BID) | ORAL | 0 refills | Status: DC
Start: 2020-03-16 — End: 2021-07-13

## 2020-03-16 MED ORDER — CEFTRIAXONE SODIUM 500 MG IJ SOLR
500.0000 mg | Freq: Once | INTRAMUSCULAR | Status: AC
  Administered 2020-03-16: 500 mg via INTRAMUSCULAR
  Filled 2020-03-16: qty 500

## 2020-03-16 MED ORDER — DOXYCYCLINE HYCLATE 100 MG PO CAPS: 100.0000 mg | ORAL_CAPSULE | Freq: Two times a day (BID) | ORAL | 0 refills | Status: DC

## 2020-03-16 MED ORDER — LIDOCAINE HCL (PF) 1 % IJ SOLN
INTRAMUSCULAR | Status: AC
  Administered 2020-03-16: 1.2 mL
  Filled 2020-03-16: qty 5
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
## 2020-03-16 NOTE — ED Notes (Signed)
Pt swabbed self

## 2020-03-16 NOTE — Discharge Instructions (Signed)
You were seen today for vaginal discharge, your wet prep shows BV, please take Flagyl twice daily for the next 7 days, do not drink alcohol while taking this medication as it will cause severe nausea and vomiting.  You were also treated today with Rocephin for potential gonorrhea, you have STD testing pending and will be called with any positive results, if you test positive for chlamydia you should fill and begin taking prescription for doxycycline, but if this test is negative you will not need this antibiotic.

## 2020-03-16 NOTE — ED Notes (Signed)
AVS provided to pt, discussed and pt teaching provided in regards to the two Rx by the EDP, opportunity for questions provided, copy of AVS also given to client as well

## 2020-03-16 NOTE — ED Triage Notes (Signed)
co vaginal discharge x 9 days

## 2020-03-17 LAB — GC/CHLAMYDIA PROBE AMP (~~LOC~~) NOT AT ARMC
Chlamydia: NEGATIVE
Comment: NEGATIVE
Comment: NORMAL
Neisseria Gonorrhea: NEGATIVE

## 2020-03-26 ENCOUNTER — Encounter: Payer: Self-pay | Admitting: Surgical

## 2020-03-26 ENCOUNTER — Ambulatory Visit (INDEPENDENT_AMBULATORY_CARE_PROVIDER_SITE_OTHER): Payer: BLUE CROSS/BLUE SHIELD | Admitting: Surgical

## 2020-03-26 DIAGNOSIS — S83002A Unspecified subluxation of left patella, initial encounter: Secondary | ICD-10-CM | POA: Diagnosis not present

## 2020-03-27 ENCOUNTER — Encounter: Payer: Self-pay | Admitting: Surgical
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
## 2020-05-10 ENCOUNTER — Encounter (HOSPITAL_BASED_OUTPATIENT_CLINIC_OR_DEPARTMENT_OTHER): Payer: Self-pay | Admitting: *Deleted

## 2020-05-10 ENCOUNTER — Other Ambulatory Visit: Payer: Self-pay

## 2020-05-10 ENCOUNTER — Emergency Department (HOSPITAL_BASED_OUTPATIENT_CLINIC_OR_DEPARTMENT_OTHER): Payer: BLUE CROSS/BLUE SHIELD

## 2020-05-10 ENCOUNTER — Emergency Department (HOSPITAL_BASED_OUTPATIENT_CLINIC_OR_DEPARTMENT_OTHER)
Admission: EM | Admit: 2020-05-10 | Discharge: 2020-05-10 | Disposition: A | Discharge status: Home / Self Care | Payer: BLUE CROSS/BLUE SHIELD | Attending: Emergency Medicine | Admitting: Emergency Medicine

## 2020-05-10 DIAGNOSIS — U071 COVID-19: Secondary | ICD-10-CM

## 2020-05-10 DIAGNOSIS — R109 Unspecified abdominal pain: Secondary | ICD-10-CM

## 2020-05-10 DIAGNOSIS — Z91040 Latex allergy status: Secondary | ICD-10-CM | POA: Diagnosis not present

## 2020-05-10 DIAGNOSIS — F17210 Nicotine dependence, cigarettes, uncomplicated: Secondary | ICD-10-CM | POA: Diagnosis not present

## 2020-05-10 DIAGNOSIS — R0602 Shortness of breath: Secondary | ICD-10-CM | POA: Diagnosis present

## 2020-05-10 LAB — CBC WITH DIFFERENTIAL/PLATELET
Abs Immature Granulocytes: 0.01 10*3/uL (ref 0.00–0.07)
Basophils Absolute: 0 10*3/uL (ref 0.0–0.1)
Basophils Relative: 0 %
Eosinophils Absolute: 0.1 10*3/uL (ref 0.0–0.5)
Eosinophils Relative: 2 %
HCT: 39.6 % (ref 36.0–46.0)
Hemoglobin: 12.9 g/dL (ref 12.0–15.0)
Immature Granulocytes: 0 %
Lymphocytes Relative: 24 %
Lymphs Abs: 1.2 10*3/uL (ref 0.7–4.0)
MCH: 28.5 pg (ref 26.0–34.0)
MCHC: 32.6 g/dL (ref 30.0–36.0)
MCV: 87.4 fL (ref 80.0–100.0)
Monocytes Absolute: 0.7 10*3/uL (ref 0.1–1.0)
Monocytes Relative: 15 %
Neutro Abs: 2.9 10*3/uL (ref 1.7–7.7)
Neutrophils Relative %: 59 %
Platelets: 265 10*3/uL (ref 150–400)
RBC: 4.53 MIL/uL (ref 3.87–5.11)
RDW: 13.6 % (ref 11.5–15.5)
WBC: 4.9 10*3/uL (ref 4.0–10.5)
nRBC: 0 % (ref 0.0–0.2)

## 2020-05-10 LAB — COMPREHENSIVE METABOLIC PANEL
ALT: 21 U/L (ref 0–44)
AST: 18 U/L (ref 15–41)
Albumin: 4 g/dL (ref 3.5–5.0)
Alkaline Phosphatase: 55 U/L (ref 38–126)
Anion gap: 11 (ref 5–15)
BUN: 9 mg/dL (ref 6–20)
CO2: 24 mmol/L (ref 22–32)
Calcium: 8.9 mg/dL (ref 8.9–10.3)
Chloride: 98 mmol/L (ref 98–111)
Creatinine, Ser: 0.64 mg/dL (ref 0.44–1.00)
GFR calc Af Amer: >60 mL/min (ref >60)
GFR calc non Af Amer: >60 mL/min (ref >60)
Glucose, Bld: 97 mg/dL (ref 70–99)
Potassium: 4.4 mmol/L (ref 3.5–5.1)
Sodium: 133 mmol/L — ABNORMAL LOW (ref 135–145)
Total Bilirubin: 0.5 mg/dL (ref 0.3–1.2)
Total Protein: 7.7 g/dL (ref 6.5–8.1)

## 2020-05-10 LAB — URINALYSIS, ROUTINE W REFLEX MICROSCOPIC
Bilirubin Urine: NEGATIVE
Glucose, UA: NEGATIVE mg/dL
Hgb urine dipstick: NEGATIVE
Ketones, ur: NEGATIVE mg/dL
Leukocytes,Ua: NEGATIVE
Nitrite: NEGATIVE
Protein, ur: NEGATIVE mg/dL
Specific Gravity, Urine: >1.03 — ABNORMAL HIGH (ref 1.005–1.030)
pH: 6 (ref 5.0–8.0)

## 2020-05-10 LAB — RESPIRATORY PANEL BY RT PCR (FLU A&B, COVID)
Influenza A by PCR: NEGATIVE
Influenza B by PCR: NEGATIVE
SARS Coronavirus 2 by RT PCR: POSITIVE — AB

## 2020-05-10 LAB — LIPASE, BLOOD: Lipase: 30 U/L (ref 11–51)

## 2020-05-10 LAB — PREGNANCY, URINE: Preg Test, Ur: NEGATIVE

## 2020-05-10 MED ORDER — ALUM & MAG HYDROXIDE-SIMETH 200-200-20 MG/5ML PO SUSP
30.0000 mL | Freq: Once | ORAL | Status: AC
  Administered 2020-05-10: 30 mL via ORAL
  Filled 2020-05-10: qty 30

## 2020-05-10 MED ORDER — LIDOCAINE VISCOUS HCL 2 % MT SOLN
15.0000 mL | Freq: Once | OROMUCOSAL | Status: AC
  Administered 2020-05-10: 15 mL via ORAL
  Filled 2020-05-10: qty 15

## 2020-05-10 NOTE — ED Notes (Signed)
EKG on standby

## 2020-05-10 NOTE — ED Notes (Signed)
Presents with back pain. ED PA at bedside, also been having abd pain with difficulty in urinating

## 2020-05-10 NOTE — Discharge Instructions (Addendum)
You need to quarantine at home for a total of 10 days from today.  You may end quarantine if you are fever free for 24 hours and your symptoms not worsening. Treat symptomatically, use Tylenol ibuprofen as needed for fever, headache, body aches. Make sure stay well-hydrated water. If you develop significant worsening shortness of breath, or come to the ER for further evaluation. I contacted the monoclonal antibody clinic to call you to discuss possible treatment. There is information below about a Covid clinic, he can follow-up with them as needed during your Covid infection for reevaluation. Return to the emergency room with any new, worsening, concerning symptoms.

## 2020-05-10 NOTE — ED Notes (Signed)
Pt ambulated in room, on room air, POX reading consistently at 98%, EDP aware
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
## 2020-05-10 NOTE — ED Triage Notes (Signed)
C/o left flank pain x 3 weeks

## 2020-05-11 ENCOUNTER — Telehealth: Payer: Self-pay | Admitting: Nurse Practitioner

## 2020-05-11 ENCOUNTER — Encounter: Payer: Self-pay | Admitting: Nurse Practitioner

## 2020-05-11 DIAGNOSIS — U071 COVID-19: Secondary | ICD-10-CM | POA: Insufficient documentation

## 2020-05-11 NOTE — Telephone Encounter (Signed)
Called to Discuss with patient about Covid symptoms and the use of regeneron, a monoclonal antibody infusion for those with mild to moderate Covid symptoms and at a high risk of hospitalization.     Pt is qualified for this infusion at the [facility] center due to co-morbid conditions and/or a member of an at-risk group.     Unable to reach pt. VM full. Sent [employer]. Sent text.   Consuello Masse, DNP, AGNP-C [phone] (Infusion Center Hotline)

## 2020-06-04 ENCOUNTER — Telehealth: Payer: Self-pay | Admitting: Orthopedic Surgery

## 2020-06-04 DIAGNOSIS — M25562 Pain in left knee: Secondary | ICD-10-CM

## 2020-06-04 NOTE — Telephone Encounter (Signed)
Please see below. Ok to sch?

## 2020-06-04 NOTE — Telephone Encounter (Signed)
Pt called stating her knee bone is popped out of place and Dr.Dean told her if this continued to be a problem she would need an MRI; pt would like to proceed with that ASAP as the popping out of place is becoming more frequent and even happened last night. Pt would like a CB when a referral has been put in  [phone]

## 2020-06-06 NOTE — Telephone Encounter (Signed)
Needs repeat appointment for evaluation first. Can see me next Friday or soonest appointment with Dr. August Saucer

## 2020-06-07 NOTE — Telephone Encounter (Signed)
IC advised per your note below. Patient refusing an appointment stating she already knows what she needs and it is not to come back in and be seen and then be sent for scan. She said she would like you you to call her to further discuss.

## 2020-06-07 NOTE — Telephone Encounter (Signed)
Want to work her in today?

## 2020-06-11 NOTE — Telephone Encounter (Signed)
ordered

## 2020-06-11 NOTE — Telephone Encounter (Signed)
Spoke with her, she has had multiple continued episodes of patellar instability  with swelling and increased pain and requests MRI scan. Recommended examination beforehand but she would like to press forward with scan.  Can you order MRI of left knee to eval MPFL?  Follow-up with Dr. August Saucer after scan

## 2020-06-25 ENCOUNTER — Other Ambulatory Visit: Payer: Self-pay | Admitting: Surgical

## 2020-06-26 ENCOUNTER — Other Ambulatory Visit: Payer: Self-pay

## 2020-06-26 ENCOUNTER — Ambulatory Visit
Admission: RE | Admit: 2020-06-26 | Discharge: 2020-06-26 | Disposition: A | Discharge status: Home / Self Care | Payer: BLUE CROSS/BLUE SHIELD | Source: Ambulatory Visit | Attending: Surgical | Admitting: Surgical

## 2020-06-26 DIAGNOSIS — M25562 Pain in left knee: Secondary | ICD-10-CM

## 2020-06-28 ENCOUNTER — Ambulatory Visit: Payer: BLUE CROSS/BLUE SHIELD | Admitting: Orthopedic Surgery

## 2020-06-28 ENCOUNTER — Telehealth: Payer: Self-pay

## 2020-06-28 NOTE — Telephone Encounter (Signed)
IC LMVM for patient to call us to schedule appt to discuss options for her knee per [facility]. He called patient and discussed scan results with her.

## 2020-06-28 NOTE — Telephone Encounter (Signed)
Scan looks ok I have not seen her doesn't look like unstable

## 2020-06-28 NOTE — Telephone Encounter (Signed)
Patient had an appointment this afternoon but was unable to come later due to having going to work. She would like to be called with her results today before she has to go to work at [employer] [phone]

## 2020-07-14 ENCOUNTER — Ambulatory Visit (INDEPENDENT_AMBULATORY_CARE_PROVIDER_SITE_OTHER): Payer: BLUE CROSS/BLUE SHIELD | Admitting: Orthopedic Surgery

## 2020-07-14 DIAGNOSIS — M25572 Pain in left ankle and joints of left foot: Secondary | ICD-10-CM | POA: Diagnosis not present

## 2020-07-14 DIAGNOSIS — M2142 Flat foot [pes planus] (acquired), left foot: Secondary | ICD-10-CM

## 2020-07-18 ENCOUNTER — Encounter: Payer: Self-pay | Admitting: Orthopedic Surgery
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
## 2020-11-07 ENCOUNTER — Encounter (HOSPITAL_BASED_OUTPATIENT_CLINIC_OR_DEPARTMENT_OTHER): Payer: Self-pay

## 2020-11-07 ENCOUNTER — Emergency Department (HOSPITAL_BASED_OUTPATIENT_CLINIC_OR_DEPARTMENT_OTHER)
Admission: EM | Admit: 2020-11-07 | Discharge: 2020-11-07 | Disposition: A | Discharge status: Home / Self Care | Payer: BLUE CROSS/BLUE SHIELD | Attending: Emergency Medicine | Admitting: Emergency Medicine

## 2020-11-07 ENCOUNTER — Other Ambulatory Visit: Payer: Self-pay

## 2020-11-07 DIAGNOSIS — N3000 Acute cystitis without hematuria: Secondary | ICD-10-CM | POA: Diagnosis not present

## 2020-11-07 DIAGNOSIS — F17210 Nicotine dependence, cigarettes, uncomplicated: Secondary | ICD-10-CM | POA: Diagnosis not present

## 2020-11-07 DIAGNOSIS — M545 Low back pain, unspecified: Secondary | ICD-10-CM | POA: Diagnosis not present

## 2020-11-07 DIAGNOSIS — Z8616 Personal history of COVID-19: Secondary | ICD-10-CM | POA: Insufficient documentation

## 2020-11-07 DIAGNOSIS — Z91040 Latex allergy status: Secondary | ICD-10-CM | POA: Insufficient documentation

## 2020-11-07 DIAGNOSIS — R1031 Right lower quadrant pain: Secondary | ICD-10-CM | POA: Diagnosis present

## 2020-11-07 LAB — URINALYSIS, MICROSCOPIC (REFLEX): WBC, UA: >50 WBC/hpf (ref 0–5)

## 2020-11-07 LAB — WET PREP, GENITAL
Clue Cells Wet Prep HPF POC: NONE SEEN
Sperm: NONE SEEN
Trich, Wet Prep: NONE SEEN
Yeast Wet Prep HPF POC: NONE SEEN

## 2020-11-07 LAB — URINALYSIS, ROUTINE W REFLEX MICROSCOPIC
Bilirubin Urine: NEGATIVE
Glucose, UA: NEGATIVE mg/dL
Ketones, ur: NEGATIVE mg/dL
Nitrite: NEGATIVE
Protein, ur: NEGATIVE mg/dL
Specific Gravity, Urine: >1.03 — ABNORMAL HIGH (ref 1.005–1.030)
pH: 6 (ref 5.0–8.0)

## 2020-11-07 LAB — PREGNANCY, URINE: Preg Test, Ur: NEGATIVE

## 2020-11-07 MED ORDER — CEFTRIAXONE SODIUM 500 MG IJ SOLR
500.0000 mg | Freq: Once | INTRAMUSCULAR | Status: AC
  Administered 2020-11-07: 500 mg via INTRAMUSCULAR
  Filled 2020-11-07: qty 500

## 2020-11-07 MED ORDER — CEPHALEXIN 500 MG PO CAPS
500.0000 mg | ORAL_CAPSULE | Freq: Two times a day (BID) | ORAL | 0 refills | Status: AC
Start: 2020-11-07 — End: 2020-11-14

## 2020-11-07 MED ORDER — CEPHALEXIN 500 MG PO CAPS
500.0000 mg | ORAL_CAPSULE | Freq: Two times a day (BID) | ORAL | 0 refills | Status: DC
Start: 2020-11-07 — End: 2020-11-07

## 2020-11-07 MED ORDER — DOXYCYCLINE HYCLATE 100 MG PO CAPS
100.0000 mg | ORAL_CAPSULE | Freq: Two times a day (BID) | ORAL | 0 refills | Status: AC
Start: 2020-11-07 — End: 2020-11-14
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
## 2020-11-07 NOTE — ED Triage Notes (Signed)
Pt c/o pelvic pain and pressure with urination with associated lower back pain.  Pt does admit to possible STD exposure or UTI since she has some dysuria x 2 days.  Denies vaginal bleeding or discharge

## 2020-11-07 NOTE — Discharge Instructions (Addendum)
Take cephalexin/Keflex 500 mg twice a day for urinary tract infection.  Fill doxycycline prescription only if you are positive for chlamydia.  You have been treated for gonorrhea.

## 2020-11-08 LAB — GC/CHLAMYDIA PROBE AMP (~~LOC~~) NOT AT ARMC
Chlamydia: NEGATIVE
Comment: NEGATIVE
Comment: NORMAL
Neisseria Gonorrhea: NEGATIVE

## 2021-01-14 IMAGING — CT CT HEAD W/O CM
3 series · 14 of 47 positions shown, 16 images · non-contrast
Comparison: Head CT dated 05/06/2019.

CLINICAL DATA: 29-year-old female with headache.

EXAM:
CT HEAD WITHOUT CONTRAST
TECHNIQUE: Contiguous axial images were obtained from the base of the skull
through the vertex without intravenous contrast.

[Series 3: head 5.0 h30s · axial · 0.45mm/px · z∈[-156,-6]mm · 8 of 36 slices shown, 10 images]
[im 3/36  brain]
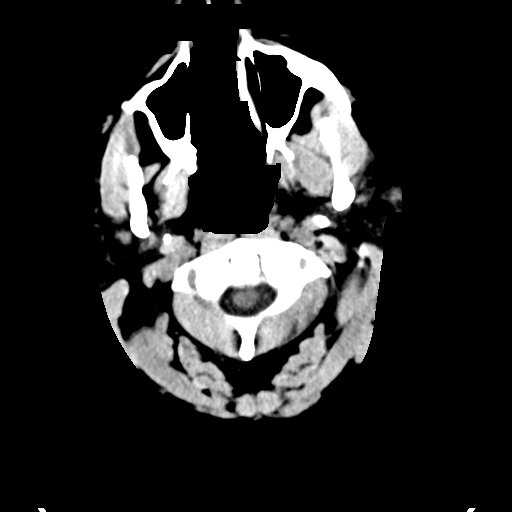
[im 3/36  bone]
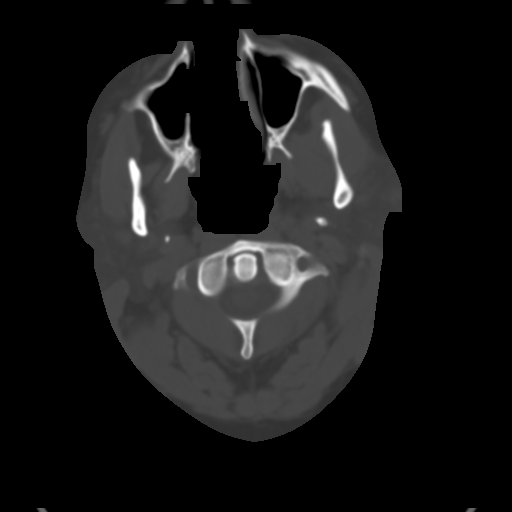
[im 8/36  brain]
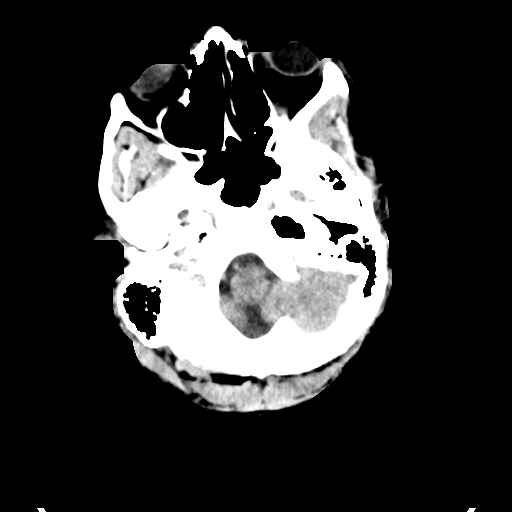
[im 11/36  brain]
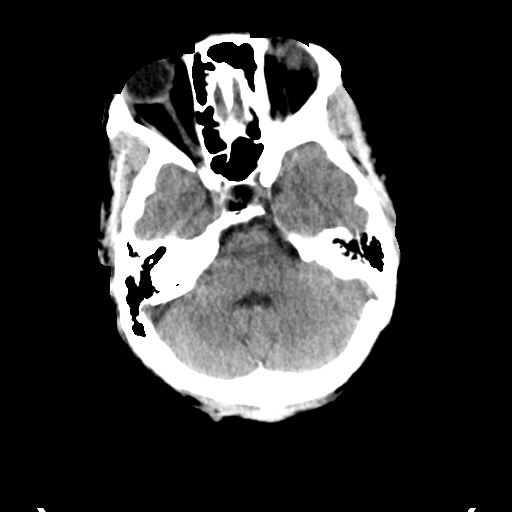
[im 16/36  brain]
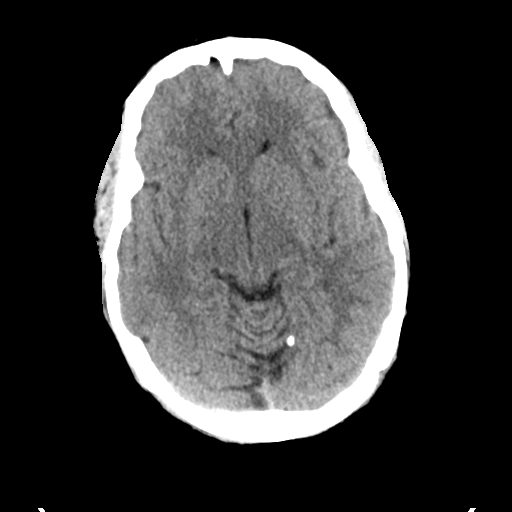
[im 20/36  brain]
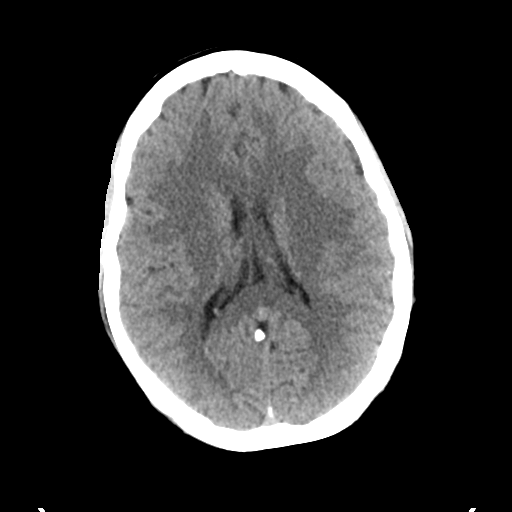
[im 20/36  bone]
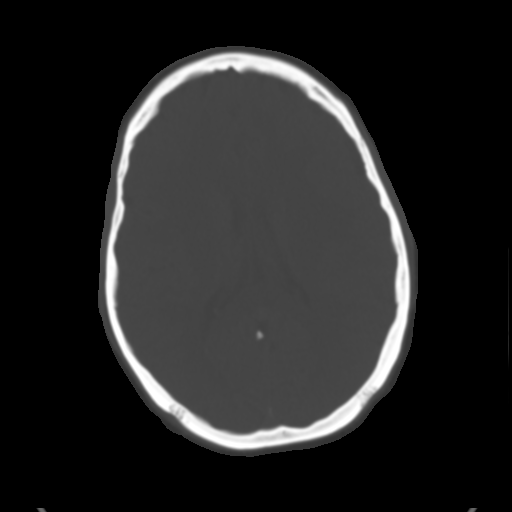
[im 25/36  brain]
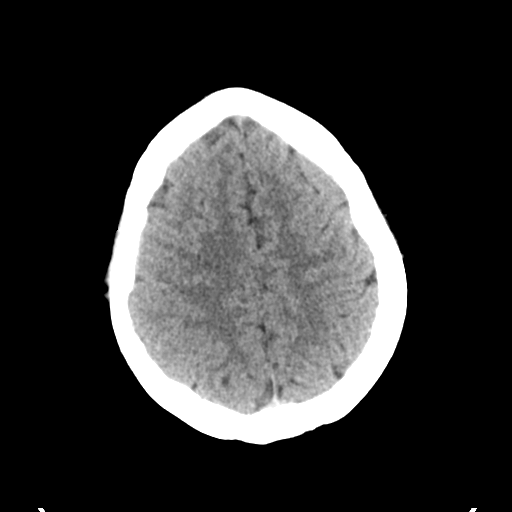
[im 28/36  brain]
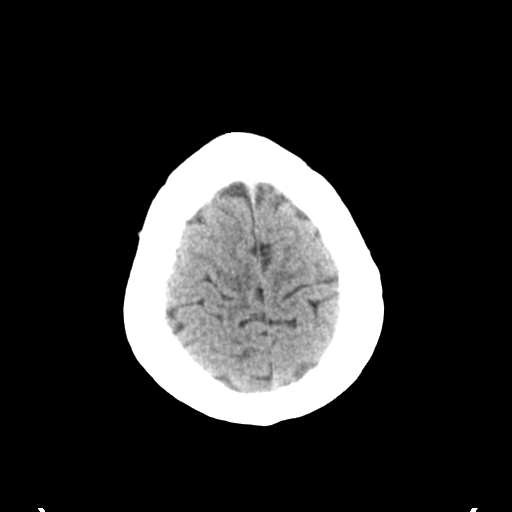
[im 33/36  brain]
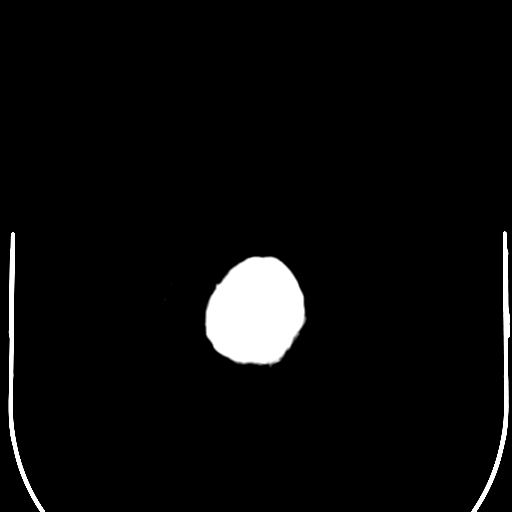

[Series 5: head 3.0 mpr cor · coronal · 0.34mm/px · 3 of 86 slices shown]
[im 29/86  brain]
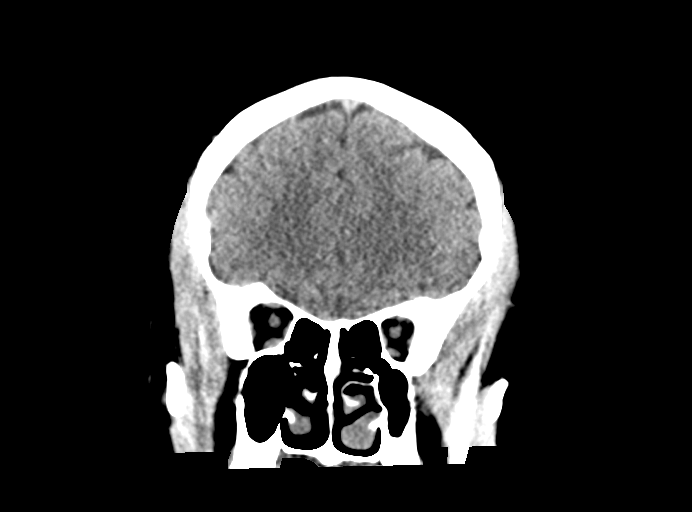
[im 38/86  brain]
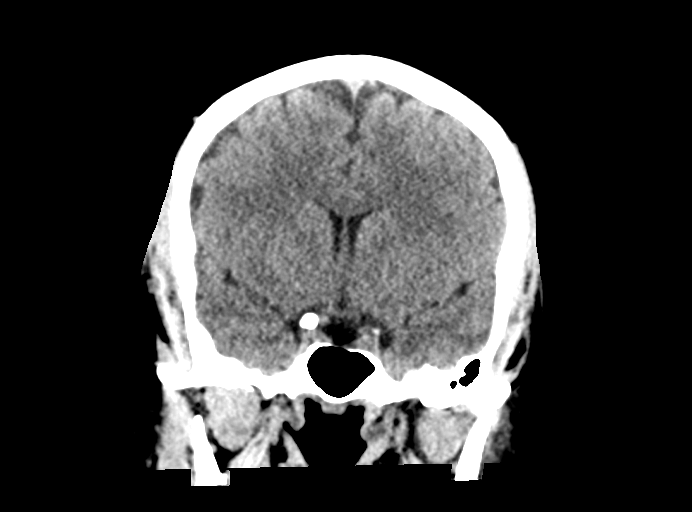
[im 48/86  brain]
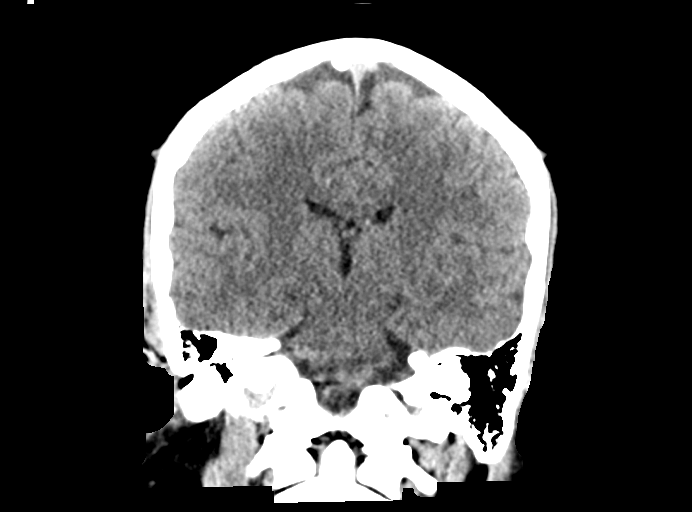

[Series 6: head 3.0 mpr sag · sagittal · 0.34mm/px · 3 of 64 slices shown]
[im 22/64  brain]
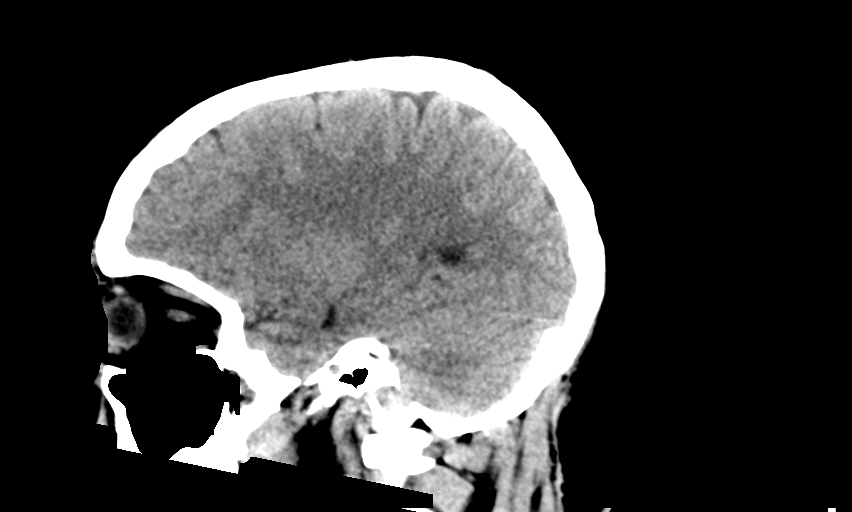
[im 32/64  brain]
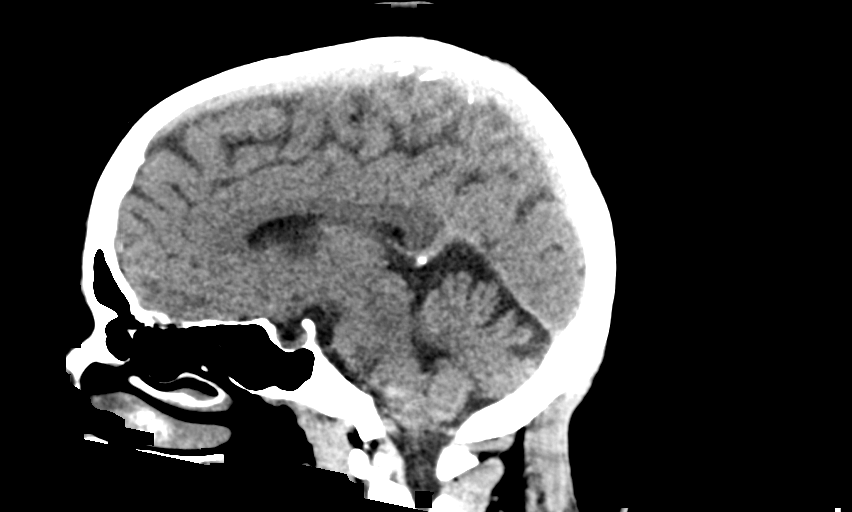
[im 43/64  brain]
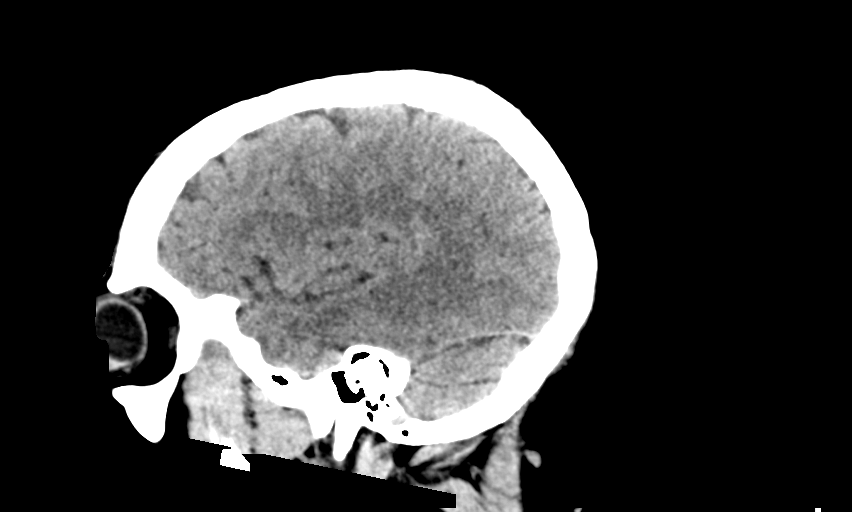

[14 of 47 positions shown; findings below may reference images not displayed]

FINDINGS: Brain: No evidence of acute infarction, hemorrhage, hydrocephalus,
extra-axial collection or mass lesion/mass effect.

Vascular: No hyperdense vessel or unexpected calcification.

Skull: Normal. Negative for fracture or focal lesion.

Sinuses/Orbits: No acute finding.

Other: None
IMPRESSION: Normal noncontrast CT of the brain.

## 2021-07-13 ENCOUNTER — Encounter (HOSPITAL_BASED_OUTPATIENT_CLINIC_OR_DEPARTMENT_OTHER): Payer: Self-pay

## 2021-07-13 ENCOUNTER — Emergency Department (HOSPITAL_BASED_OUTPATIENT_CLINIC_OR_DEPARTMENT_OTHER)
Admission: EM | Admit: 2021-07-13 | Discharge: 2021-07-13 | Disposition: A | Discharge status: Home / Self Care | Payer: BLUE CROSS/BLUE SHIELD | Attending: Emergency Medicine | Admitting: Emergency Medicine

## 2021-07-13 ENCOUNTER — Other Ambulatory Visit: Payer: Self-pay

## 2021-07-13 DIAGNOSIS — Z87891 Personal history of nicotine dependence: Secondary | ICD-10-CM | POA: Insufficient documentation

## 2021-07-13 DIAGNOSIS — Z91040 Latex allergy status: Secondary | ICD-10-CM | POA: Diagnosis not present

## 2021-07-13 DIAGNOSIS — Z202 Contact with and (suspected) exposure to infections with a predominantly sexual mode of transmission: Secondary | ICD-10-CM | POA: Insufficient documentation

## 2021-07-13 DIAGNOSIS — Z8616 Personal history of COVID-19: Secondary | ICD-10-CM | POA: Diagnosis not present

## 2021-07-13 LAB — URINALYSIS, ROUTINE W REFLEX MICROSCOPIC
Glucose, UA: NEGATIVE mg/dL
Hgb urine dipstick: NEGATIVE
Ketones, ur: 15 mg/dL — AB
Leukocytes,Ua: NEGATIVE
Nitrite: NEGATIVE
Protein, ur: NEGATIVE mg/dL
Specific Gravity, Urine: 1.025 (ref 1.005–1.030)
pH: 5.5 (ref 5.0–8.0)

## 2021-07-13 LAB — WET PREP, GENITAL
Sperm: NONE SEEN
Trich, Wet Prep: NONE SEEN
WBC, Wet Prep HPF POC: <10 (ref <10)
Yeast Wet Prep HPF POC: NONE SEEN

## 2021-07-13 LAB — PREGNANCY, URINE: Preg Test, Ur: NEGATIVE

## 2021-07-13 MED ORDER — CEFTRIAXONE SODIUM 500 MG IJ SOLR
500.0000 mg | Freq: Once | INTRAMUSCULAR | Status: AC
  Administered 2021-07-13: 500 mg via INTRAMUSCULAR
  Filled 2021-07-13: qty 500

## 2021-07-13 MED ORDER — METRONIDAZOLE 500 MG PO TABS: 500.0000 mg | ORAL_TABLET | Freq: Two times a day (BID) | ORAL | 0 refills | Status: DC

## 2021-07-13 MED ORDER — AZITHROMYCIN 250 MG PO TABS
1000.0000 mg | ORAL_TABLET | Freq: Once | ORAL | Status: AC
  Administered 2021-07-13: 1000 mg via ORAL
  Filled 2021-07-13: qty 4

## 2021-07-13 MED ORDER — LIDOCAINE HCL (PF) 1 % IJ SOLN
1.0000 mL | Freq: Once | INTRAMUSCULAR | Status: AC
  Administered 2021-07-13: 1 mL
  Filled 2021-07-13: qty 5

## 2021-07-13 NOTE — Discharge Instructions (Addendum)
Your wet mount test today showed signs of possible bacterial vaginosis infection.  Take antibiotics as written for this.  Avoid sexual contact until your infection is cleared and you have been cleared by your primary care team. Call your primary care doctor for any additional follow-up and you will need within this week.  Return immediately back to the ER if:  Your symptoms worsen within the next 12-24 hours. You develop new symptoms such as new fevers, persistent vomiting, new pain, shortness of breath, or new weakness or numbness, or if you have any other concerns.

## 2021-07-13 NOTE — ED Triage Notes (Signed)
Pt arrives with reports of having unprotected sex states that she would like to be tested for STDs. LMP 07/08/2021. Sexual encounter was two weeks ago. Pt reports some vaginal itching and odor, no discharge or irritation
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
## 2021-07-14 LAB — GC/CHLAMYDIA PROBE AMP (~~LOC~~) NOT AT ARMC
Chlamydia: NEGATIVE
Comment: NEGATIVE
Comment: NORMAL
Neisseria Gonorrhea: NEGATIVE

## 2021-09-13 ENCOUNTER — Other Ambulatory Visit: Payer: Self-pay

## 2021-09-13 ENCOUNTER — Emergency Department (HOSPITAL_BASED_OUTPATIENT_CLINIC_OR_DEPARTMENT_OTHER)
Admission: EM | Admit: 2021-09-13 | Discharge: 2021-09-13 | Disposition: A | Discharge status: Home / Self Care | Payer: BLUE CROSS/BLUE SHIELD | Attending: Emergency Medicine | Admitting: Emergency Medicine

## 2021-09-13 ENCOUNTER — Encounter (HOSPITAL_BASED_OUTPATIENT_CLINIC_OR_DEPARTMENT_OTHER): Payer: Self-pay

## 2021-09-13 DIAGNOSIS — Z91040 Latex allergy status: Secondary | ICD-10-CM | POA: Insufficient documentation

## 2021-09-13 DIAGNOSIS — N898 Other specified noninflammatory disorders of vagina: Secondary | ICD-10-CM | POA: Insufficient documentation

## 2021-09-13 DIAGNOSIS — B9689 Other specified bacterial agents as the cause of diseases classified elsewhere: Secondary | ICD-10-CM

## 2021-09-13 DIAGNOSIS — I10 Essential (primary) hypertension: Secondary | ICD-10-CM | POA: Insufficient documentation

## 2021-09-13 LAB — WET PREP, GENITAL
Sperm: NONE SEEN
Trich, Wet Prep: NONE SEEN
WBC, Wet Prep HPF POC: >=10 — AB (ref <10)
Yeast Wet Prep HPF POC: NONE SEEN

## 2021-09-13 LAB — URINALYSIS, ROUTINE W REFLEX MICROSCOPIC
Bilirubin Urine: NEGATIVE
Glucose, UA: NEGATIVE mg/dL
Hgb urine dipstick: NEGATIVE
Ketones, ur: NEGATIVE mg/dL
Leukocytes,Ua: NEGATIVE
Nitrite: NEGATIVE
Protein, ur: NEGATIVE mg/dL
Specific Gravity, Urine: 1.025 (ref 1.005–1.030)
pH: 6.5 (ref 5.0–8.0)

## 2021-09-13 LAB — HIV ANTIBODY (ROUTINE TESTING W REFLEX): HIV Screen 4th Generation wRfx: NONREACTIVE

## 2021-09-13 LAB — PREGNANCY, URINE: Preg Test, Ur: NEGATIVE

## 2021-09-13 MED ORDER — DOXYCYCLINE HYCLATE 100 MG PO TABS
100.0000 mg | ORAL_TABLET | Freq: Once | ORAL | Status: AC
  Administered 2021-09-13: 100 mg via ORAL
  Filled 2021-09-13: qty 1

## 2021-09-13 MED ORDER — METRONIDAZOLE 500 MG PO TABS: 500.0000 mg | ORAL_TABLET | Freq: Two times a day (BID) | ORAL | 0 refills | Status: DC

## 2021-09-13 MED ORDER — LIDOCAINE HCL (PF) 1 % IJ SOLN
INTRAMUSCULAR | Status: AC
  Administered 2021-09-13: 5 mL
  Filled 2021-09-13: qty 5

## 2021-09-13 MED ORDER — CEFTRIAXONE SODIUM 500 MG IJ SOLR
500.0000 mg | Freq: Once | INTRAMUSCULAR | Status: AC
  Administered 2021-09-13: 500 mg via INTRAMUSCULAR
  Filled 2021-09-13: qty 500

## 2021-09-13 MED ORDER — DOXYCYCLINE HYCLATE 100 MG PO CAPS: 100.0000 mg | ORAL_CAPSULE | Freq: Two times a day (BID) | ORAL | 0 refills | Status: AC

## 2021-09-13 NOTE — Discharge Instructions (Signed)
You are seen in the ER today for vaginal itching and discharge.  You were diagnosed with bacterial vaginosis and been prescribed Flagyl to take at home for the next week.  Additionally you elected to be treated presumptively for gonorrhea and chlamydia.  As part of this treatment you have been prescribed doxycycline to take twice a day for the next week.  We may follow your test results for HIV, syphilis, gonorrhea and chlamydia in your MyChart account.  Return to the ER with any severe symptoms and follow-up with your OB/GYN as previously scheduled.

## 2021-09-13 NOTE — ED Notes (Signed)
ED Provider at bedside. 

## 2021-09-13 NOTE — ED Notes (Signed)
Pelvic done with speculum and bi manual, pt tolerated well spec to lab 

## 2021-09-13 NOTE — ED Triage Notes (Signed)
2 weeks ago went to PCP for vaginal odor & itching, negative wet prep. Took flagyl, continues to have odor. States concerned for STD.

## 2021-09-13 NOTE — ED Provider Notes (Signed)
Alicia Morgan   CSN: [id number] Arrival date & time: 09/13/21  [id number]     History  Chief Complaint  Patient presents with   Vaginal Itching    Alicia Morgan is a 31 y.o. female who presents with concern for persistent vaginal odor and itching since the end of December when she was last sexually active.  States that she was having intercourse with a female partner with a condom in place when the condom came off but he continued to have penetrative vaginal intercourse.  She since that time she has "felt its way more moist down there" it has been itchy.  States that she does not have any desire to sexually active which is abnormal for her.  She states that she saw her primary care doctor 2 weeks ago and had normal wet prep but was still prescribed Flagyl which she took for a week without improvement in her symptoms.  Has been referred to OB/GYN but they cannot see her until February.  I personally reviewed this patient's medical records.  She has history of hypertension gestational hypertension, STI in the past.  She is not on any medications daily.  HPI     Home Medications Prior to Admission medications   Medication Sig Start Date End Date Taking? Authorizing Provider  celecoxib (CELEBREX) 200 MG capsule Take 1 capsule (200 mg total) by mouth 2 (two) times daily. 02/28/20   Margarita Mail, PA-C  cyclobenzaprine (FLEXERIL) 10 MG tablet Take 1 tablet (10 mg total) by mouth 2 (two) times daily as needed for muscle spasms. 05/06/19   Robinson, [place] N, PA-C  meloxicam (MOBIC) 15 MG tablet Take 1 tablet (15 mg total) by mouth daily. 03/05/20   Magnant, Charles L, PA-C  methocarbamol (ROBAXIN) 500 MG tablet Take 1 tablet (500 mg total) by mouth 2 (two) times daily. 07/02/19   Suzy Bouchard, PA-C  metroNIDAZOLE (FLAGYL) 500 MG tablet Take 1 tablet (500 mg total) by mouth 2 (two) times daily. 07/13/21   Luna Fuse, MD      Allergies     Latex    Review of Systems   Review of Systems  Constitutional: Negative.   HENT: Negative.    Respiratory: Negative.    Cardiovascular: Negative.   Gastrointestinal: Negative.   Genitourinary:  Negative for decreased urine volume, hematuria, menstrual problem, urgency, vaginal bleeding and vaginal discharge.       Vaginal itching  Musculoskeletal: Negative.    Physical Exam Updated Vital Signs BP 136/82 (BP Location: Right Arm)    Pulse 67    Temp 98.5 F (36.9 C) (Oral)    Resp 18    Ht 5\' 8"  (1.727 m)    Wt 119.7 kg    LMP 08/29/2021 (Approximate)    SpO2 100%    BMI 40.14 kg/m  Physical Exam Vitals and nursing Morgan reviewed. Exam conducted with a chaperone present.  Constitutional:      Appearance: She is obese. She is not toxic-appearing.  HENT:     Head: Normocephalic and atraumatic.     Mouth/Throat:     Mouth: Mucous membranes are moist.     Pharynx: No oropharyngeal exudate or posterior oropharyngeal erythema.  Eyes:     General:        Right eye: No discharge.        Left eye: No discharge.     Conjunctiva/sclera: Conjunctivae normal.  Cardiovascular:     Rate and Rhythm:  Normal rate and regular rhythm.     Pulses: Normal pulses.  Pulmonary:     Effort: Pulmonary effort is normal. No respiratory distress.     Breath sounds: Normal breath sounds. No wheezing or rales.  Abdominal:     General: Bowel sounds are normal. There is no distension.     Palpations: Abdomen is soft.     Tenderness: There is no abdominal tenderness. There is no right CVA tenderness or left CVA tenderness.  Genitourinary:    General: Normal vulva.     Exam position: Lithotomy position.     Vagina: No foreign body. Vaginal discharge present. No tenderness or bleeding.     Cervix: Normal.     Uterus: Normal.      Adnexa: Right adnexa normal and left adnexa normal.     Comments: No skin changes to the vulva though patient does endorse itching to the labia bilaterally.  thick white and  brown discharge present in the vaginal vault, moderate amount Musculoskeletal:        General: No deformity.     Cervical back: Neck supple.     Right lower leg: No edema.     Left lower leg: No edema.  Skin:    General: Skin is warm and dry.     Capillary Refill: Capillary refill takes less than 2 seconds.  Neurological:     General: No focal deficit present.     Mental Status: She is alert. Mental status is at baseline.  Psychiatric:        Mood and Affect: Mood normal.    ED Results / Procedures / Treatments   Labs (all labs ordered are listed, but only abnormal results are displayed) Labs Reviewed - No data to display  EKG None  Radiology No results found.  Procedures Procedures    Medications Ordered in ED Medications - No data to display  ED Course/ Medical Decision Making/ A&P                           Medical Decision Making 31 year old female presents with request for STI testing as well as concern for a few weeks of vaginal itching.  Patient is a normal intake.  Cardiopulmonary exam is normal without exam benign. Pelvic exam as above with copious thick white and brown discharge.  Amount and/or Complexity of Data Reviewed Labs: ordered.    Details: Wet mount with clue cells, otherwise unremarkable.  UA unremarkable.  HIV, RPR, GC/chlamydia pending.  Risk Prescription drug management.   Shared decision-making with the patient regarding presumptive therapy for gonorrhea and chlamydia versus waiting for test results, given her concern for exposure to STI and abnormal exam today.  She voiced understanding of her medical evaluation and treatment options and expressed wishes to proceed with presumptive therapy at this time.  Rocephin and doxycycline ordered.  Will discharge with prescriptions for doxycycline and Flagyl given bacterial vaginosis as well.  No further work-up warranted at this time given reassuring vital signs and physical exam without indication  for admission. Laquita voiced understanding of her medical evaluation and treatment plan.  Each of her questions was answered to her expressed satisfaction.  Return precautions were given.  Patient was well-appearing, stable, and was discharged in good condition.    This chart was dictated using voice recognition software, Dragon. Despite the best efforts of this provider to proofread and correct errors, errors may still occur which can change documentation meaning.  Final Clinical Impression(s) / ED Diagnoses Final diagnoses:  None    Rx / DC Orders ED Discharge Orders     None         Aura Dials 09/13/21 1151    Wyvonnia Dusky, MD 09/13/21 [phone]

## 2021-09-14 LAB — GC/CHLAMYDIA PROBE AMP (~~LOC~~) NOT AT ARMC
Chlamydia: NEGATIVE
Comment: NEGATIVE
Comment: NORMAL
Neisseria Gonorrhea: NEGATIVE

## 2021-09-14 LAB — RPR: RPR Ser Ql: NONREACTIVE

## 2022-02-21 ENCOUNTER — Inpatient Hospital Stay (HOSPITAL_BASED_OUTPATIENT_CLINIC_OR_DEPARTMENT_OTHER)
Admission: EM | Admit: 2022-02-21 | Discharge: 2022-02-22 | Disposition: A | Discharge status: Home / Self Care | Payer: Self-pay | Attending: Obstetrics & Gynecology | Admitting: Obstetrics & Gynecology

## 2022-02-21 ENCOUNTER — Encounter (HOSPITAL_BASED_OUTPATIENT_CLINIC_OR_DEPARTMENT_OTHER): Payer: Self-pay | Admitting: Emergency Medicine

## 2022-02-21 DIAGNOSIS — D62 Acute posthemorrhagic anemia: Secondary | ICD-10-CM | POA: Insufficient documentation

## 2022-02-21 DIAGNOSIS — N939 Abnormal uterine and vaginal bleeding, unspecified: Secondary | ICD-10-CM

## 2022-02-21 DIAGNOSIS — O074 Failed attempted termination of pregnancy without complication: Secondary | ICD-10-CM | POA: Diagnosis present

## 2022-02-21 DIAGNOSIS — O071 Delayed or excessive hemorrhage following failed attempted termination of pregnancy: Secondary | ICD-10-CM | POA: Insufficient documentation

## 2022-02-21 LAB — CBC
HCT: 26.2 % — ABNORMAL LOW (ref 36.0–46.0)
HCT: 26.6 % — ABNORMAL LOW (ref 36.0–46.0)
Hemoglobin: 8.9 g/dL — ABNORMAL LOW (ref 12.0–15.0)
Hemoglobin: 8.9 g/dL — ABNORMAL LOW (ref 12.0–15.0)
MCH: 29.8 pg (ref 26.0–34.0)
MCH: 29.4 pg (ref 26.0–34.0)
MCHC: 34 g/dL (ref 30.0–36.0)
MCHC: 33.5 g/dL (ref 30.0–36.0)
MCV: 87.6 fL (ref 80.0–100.0)
MCV: 87.8 fL (ref 80.0–100.0)
Platelets: 297 10*3/uL (ref 150–400)
Platelets: 282 10*3/uL (ref 150–400)
RBC: 2.99 MIL/uL — ABNORMAL LOW (ref 3.87–5.11)
RBC: 3.03 MIL/uL — ABNORMAL LOW (ref 3.87–5.11)
RDW: 12.3 % (ref 11.5–15.5)
RDW: 12.4 % (ref 11.5–15.5)
WBC: 6.7 10*3/uL (ref 4.0–10.5)
WBC: 7.1 10*3/uL (ref 4.0–10.5)
nRBC: 0 % (ref 0.0–0.2)
nRBC: 0 % (ref 0.0–0.2)

## 2022-02-21 LAB — BASIC METABOLIC PANEL
Anion gap: 7 (ref 5–15)
BUN: 10 mg/dL (ref 6–20)
CO2: 24 mmol/L (ref 22–32)
Calcium: 8.3 mg/dL — ABNORMAL LOW (ref 8.9–10.3)
Chloride: 105 mmol/L (ref 98–111)
Creatinine, Ser: 0.53 mg/dL (ref 0.44–1.00)
GFR, Estimated: >60 mL/min (ref >60)
Glucose, Bld: 96 mg/dL (ref 70–99)
Potassium: 3.7 mmol/L (ref 3.5–5.1)
Sodium: 136 mmol/L (ref 135–145)

## 2022-02-21 MED ORDER — LACTATED RINGERS IV SOLN: INTRAVENOUS | Status: DC

## 2022-02-21 MED ORDER — ACETAMINOPHEN 325 MG PO TABS
650.0000 mg | ORAL_TABLET | Freq: Once | ORAL | Status: AC | PRN
  Administered 2022-02-21: 650 mg via ORAL
  Filled 2022-02-21: qty 2

## 2022-02-21 NOTE — ED Provider Notes (Signed)
[facility] EMERGENCY DEPARTMENT Provider Note   CSN: [id number] Arrival date & time: 02/21/22  1905     History  Chief Complaint  Patient presents with   Headache   Vaginal Bleeding    Alicia Morgan is a 31 y.o. female G4, P2 LMP 01/06/2022 who presents today for evaluation of heavy vaginal bleeding, and feeling lightheaded. She states that she took misoprostol on Friday and then again on Saturday.  She had some moderate bleeding however that had improved until today.  Today she has had a significant increase in her bleeding.  She states that she has been wearing a adult diaper with an overnight pad inside of it and has bled through that onto her clothing 3 times over the past 8 hours.  She does not take any blood thinning medications.  She states that she feels very lightheaded like she is can a pass out anytime she stands up.  Patient states that if medically necessary she would accept a blood transfusion.   HPI     Home Medications Prior to Admission medications   Medication Sig Start Date End Date Taking? Authorizing Provider  celecoxib (CELEBREX) 200 MG capsule Take 1 capsule (200 mg total) by mouth 2 (two) times daily. 02/28/20   Arthor Captain, PA-C  cyclobenzaprine (FLEXERIL) 10 MG tablet Take 1 tablet (10 mg total) by mouth 2 (two) times daily as needed for muscle spasms. 05/06/19   Robinson, [place] N, PA-C  meloxicam (MOBIC) 15 MG tablet Take 1 tablet (15 mg total) by mouth daily. 03/05/20   Magnant, Charles L, PA-C  methocarbamol (ROBAXIN) 500 MG tablet Take 1 tablet (500 mg total) by mouth 2 (two) times daily. 07/02/19   Mannie Stabile, PA-C  metroNIDAZOLE (FLAGYL) 500 MG tablet Take 1 tablet (500 mg total) by mouth 2 (two) times daily. 09/13/21   Sponseller, Eugene Gavia, PA-C      Allergies    Oxycodone and Latex     Physical Exam Updated Vital Signs BP 121/67 (BP Location: Right Arm)   Pulse 63   Temp 98.3 F (36.8 C)   Resp 18   LMP  02/18/2022 (Exact Date)   SpO2 100%  Physical Exam Vitals and nursing note reviewed. Exam conducted with a chaperone present Production designer, theatre/television/film).  Constitutional:      General: She is not in acute distress.    Appearance: She is not diaphoretic.  HENT:     Head: Normocephalic and atraumatic.  Eyes:     General: No scleral icterus.       Right eye: No discharge.        Left eye: No discharge.     Conjunctiva/sclera: Conjunctivae normal.  Cardiovascular:     Rate and Rhythm: Normal rate and regular rhythm.  Pulmonary:     Effort: Pulmonary effort is normal. No respiratory distress.     Breath sounds: No stridor.  Abdominal:     General: There is no distension.  Genitourinary:    Comments: Normal external female genitalia.  When attempting to insert speculum bright red vaginal blood occludes field of view and pours out of the vaginal canal.  Unable to complete pelvic exam due to amount of blood and patients pain.  Musculoskeletal:        General: No deformity.     Cervical back: Normal range of motion.  Skin:    General: Skin is warm and dry.  Neurological:     Mental Status: She is alert.  Motor: No abnormal muscle tone.  Psychiatric:        Behavior: Behavior normal.     ED Results / Procedures / Treatments   Labs (all labs ordered are listed, but only abnormal results are displayed) Labs Reviewed  CBC - Abnormal; Notable for the following components:      Result Value   RBC 2.99 (*)    Hemoglobin 8.9 (*)    HCT 26.2 (*)    All other components within normal limits  CBC - Abnormal; Notable for the following components:   RBC 3.03 (*)    Hemoglobin 8.9 (*)    HCT 26.6 (*)    All other components within normal limits  BASIC METABOLIC PANEL    EKG None  Radiology No results found.  Procedures Procedures    Medications Ordered in ED Medications  lactated ringers infusion ( Intravenous New Bag/Given 02/21/22 2331)  acetaminophen (TYLENOL) tablet 650 mg (650 mg  Oral Given 02/21/22 1925)    ED Course/ Medical Decision Making/ A&P Clinical Course as of 02/21/22 2343  Tue Feb 21, 2022  2302 I spoke with Dr. Macon Large with obgyn who accepts patient in transfer to MAU.  [EH]  2305 Spoke with secretary about getting [facility] to transport patient. [EH]  2343 Hemoglobin(!): 8.9 Not changed since initial.  [EH]    Clinical Course User Index [EH] Cristina Gong, PA-C                           Medical Decision Making Amount and/or Complexity of Data Reviewed Labs: ordered.  Risk OTC drugs. Prescription drug management.   This patient presents to the ED for concern of vaginal bleeding, this involves an extensive number of treatment options, and is a complaint that carries with it a high risk of complications and morbidity.  The differential diagnosis includes retained products of conception, anemia, complications from medical abortion   Additional history obtained:  Additional history obtained from chart review, patient's friend at bedside External records from outside source obtained and reviewed including PCP visit from 02/02/2022.  Patient had labs obtained at that time with a positive hCG.   Lab Tests:  I Ordered, and personally interpreted labs.  The pertinent results include: Initial CBC with a hemoglobin of 8.9.  Most recent prior for comparison was a year ago and it was 12.9.   Imaging Studies ordered: Given patient's recent medical abortion with combined heavy bleeding ultrasound is considered, however is not available at this facility at this time.   Consultations Obtained:  I requested consultation with OB/GYN Dr. Macon Large at [facility] and discussed lab and imaging findings as well as pertinent plan given lack of ultrasound, concern for ongoing very heavy bleeding, anemia, and limited transfusion/blood bank capabilities at this facility patient will require transfer to MAU for further evaluation.  Dr. Macon Large accepts patient in  transfer.  As a Diplomatic Services operational officer to contact [facility] to facilitate this.    Problem List / ED Course / Critical interventions / Medication management  Vaginal bleeding, anemia, I ordered medication including LR for headache, presyncope with standing Reevaluation of the patient after these medicines showed that the patient improved   Patient requires testing and access to care and capabilities not available at this facility. She will be transferred to [facility], MAU by [facility].  Prior to patient leaving the department a repeat CBC did result, her tachycardia had improved, and her hemoglobin was unchanged.  Given  this she does not appear to require emergent release of uncrossed match blood and appears stable for timely transfer to MAU.  Note: Portions of this report may have been transcribed using voice recognition software. Every effort was made to ensure accuracy; however, inadvertent computerized transcription errors may be present   Final Clinical Impression(s) / ED Diagnoses Final diagnoses:  Vaginal bleeding    Rx / DC Orders ED Discharge Orders     None         Norman Clay 02/21/22 2349    Cheryll Cockayne, MD 02/26/22 [phone]

## 2022-02-21 NOTE — ED Triage Notes (Signed)
Reports getting an abortion on Friday, now having heavy menstrual bleeding with clots. 2 menstrual pads in one hour. Reports inserting the abortion pill vaginally instead of orally. Denies blood thinners.   Headache started today.

## 2022-02-22 ENCOUNTER — Inpatient Hospital Stay (HOSPITAL_COMMUNITY): Payer: Self-pay

## 2022-02-22 ENCOUNTER — Encounter (HOSPITAL_COMMUNITY): Payer: Self-pay | Admitting: Obstetrics & Gynecology

## 2022-02-22 DIAGNOSIS — N939 Abnormal uterine and vaginal bleeding, unspecified: Secondary | ICD-10-CM

## 2022-02-22 DIAGNOSIS — O074 Failed attempted termination of pregnancy without complication: Secondary | ICD-10-CM | POA: Diagnosis present

## 2022-02-22 DIAGNOSIS — O034 Incomplete spontaneous abortion without complication: Secondary | ICD-10-CM

## 2022-02-22 MED ORDER — TRAMADOL HCL 50 MG PO TABS: 50.0000 mg | ORAL_TABLET | Freq: Four times a day (QID) | ORAL | 0 refills | Status: AC | PRN

## 2022-02-22 MED ORDER — IBUPROFEN 600 MG PO TABS: 600.0000 mg | ORAL_TABLET | Freq: Four times a day (QID) | ORAL | 1 refills | Status: DC | PRN

## 2022-02-22 MED ORDER — LIDOCAINE HCL (PF) 1 % IJ SOLN
INTRAMUSCULAR | Status: AC
  Administered 2022-02-22: 5 mL
  Filled 2022-02-22: qty 5

## 2022-02-22 MED ORDER — LORAZEPAM 2 MG/ML IJ SOLN
0.5000 mg | Freq: Once | INTRAMUSCULAR | Status: AC
  Administered 2022-02-22: 0.5 mg via INTRAVENOUS
  Filled 2022-02-22: qty 1

## 2022-02-22 MED ORDER — KETOROLAC TROMETHAMINE 30 MG/ML IJ SOLN
30.0000 mg | Freq: Once | INTRAMUSCULAR | Status: AC
  Administered 2022-02-22: 30 mg via INTRAVENOUS
  Filled 2022-02-22: qty 1

## 2022-02-22 MED ORDER — DOXYCYCLINE HYCLATE 100 MG PO TABS
200.0000 mg | ORAL_TABLET | Freq: Once | ORAL | Status: AC
  Administered 2022-02-22: 200 mg via ORAL
  Filled 2022-02-22: qty 2

## 2022-02-22 MED ORDER — FENTANYL CITRATE (PF) 100 MCG/2ML IJ SOLN
100.0000 ug | Freq: Once | INTRAMUSCULAR | Status: AC
  Administered 2022-02-22: 100 ug via INTRAVENOUS
  Filled 2022-02-22: qty 2

## 2022-02-22 MED ORDER — LORAZEPAM 2 MG/ML IJ SOLN
0.5000 mg | Freq: Once | INTRAMUSCULAR | Status: AC
  Administered 2022-02-22: 0.5 mg via INTRAVENOUS

## 2022-02-22 NOTE — MAU Note (Signed)
.  Afsa R Galyon is a 31 y.o. at Unknown here in MAU reporting: transfer from [facility] via EMS for VB post abortion. Pt report having VB after taking vaginally abortion medication on 02/18/2022. Pt states she has been saturating 2 pads an hour.   Onset of complaint: 02/18/2022 Pain score: 0/10 Vitals:   02/21/22 2333 02/22/22 0030  BP: 121/67 (!) 121/56  Pulse: 63 66  Resp: 18 18  Temp: 98.3 F (36.8 C) 98.4 F (36.9 C)  SpO2: 100%       Lab orders placed from triage:

## 2022-02-22 NOTE — MAU Provider Note (Signed)
Chief Complaint: Headache and Vaginal Bleeding   Event Date/Time   First Provider Initiated Contact with Patient 02/22/22 0026        SUBJECTIVE HPI: Alicia Morgan is a 31 y.o. [id number] who presents via [facility] to maternity admissions reporting heavy vaginal bleeding after a medical abortion on Friday States never had any pain or cramping but has bled heavily.  . She denies vaginal bleeding, vaginal itching/burning, urinary symptoms, h/a, dizziness, n/v, or fever/chills.    Headache  This is a new problem. The current episode started today. The pain does not radiate. The quality of the pain is described as aching. Pertinent negatives include no abdominal pain or fever. Nothing aggravates the symptoms. She has tried nothing for the symptoms.  Vaginal Bleeding The patient's primary symptoms include vaginal bleeding. The patient's pertinent negatives include no pelvic pain. This is a new problem. The current episode started in the past 7 days. The problem occurs constantly. The patient is experiencing no pain. Associated symptoms include headaches. Pertinent negatives include no abdominal pain, chills or fever. The vaginal discharge was bloody. The vaginal bleeding is heavier than menses. She has been passing clots. She has been passing tissue. Nothing aggravates the symptoms. She has tried nothing for the symptoms.   ED Note: Alicia Morgan is a 31 y.o. female G4, P2 LMP 01/06/2022 who presents today for evaluation of heavy vaginal bleeding, and feeling lightheaded. She states that she took misoprostol on Friday and then again on Saturday.  She had some moderate bleeding however that had improved until today.  Today she has had a significant increase in her bleeding.  She states that she has been wearing a adult diaper with an overnight pad inside of it and has bled through that onto her clothing 3 times over the past 8 hours  Past Medical History:  Diagnosis Date   Anemia     Chlamydia    Depression    Gestational hypertension 02/03/2015   Headache    Obesity    Past Surgical History:  Procedure Laterality Date   FRACTURE SURGERY     L tibia    GASTRIC BYPASS     Apr 12 2021   Open reduction and internal fixation of ankle fracture.     Social History   Socioeconomic History   Marital status: Single    Spouse name: Not on file   Number of children: Not on file   Years of education: Not on file   Highest education level: Not on file  Occupational History   Not on file  Tobacco Use   Smoking status: Former    Types: Cigarettes    Quit date: 08/2020    Years since quitting: 1.5   Smokeless tobacco: Never  Vaping Use   Vaping Use: Never used  Substance and Sexual Activity   Alcohol use: Yes    Comment: occasionally   Drug use: No   Sexual activity: Not on file  Other Topics Concern   Not on file  Social History Narrative   Not on file   Social Determinants of Health   Financial Resource Strain: Not on file  Food Insecurity: Not on file  Transportation Needs: Not on file  Physical Activity: Not on file  Stress: Not on file  Social Connections: Not on file  Intimate Partner Violence: Not on file   Current Facility-Administered Medications on File Prior to Encounter  Medication Dose Route Frequency Provider Last Rate Last Admin   lactated  ringers bolus 1,000 mL  1,000 mL Intravenous Once Standard, Venus, CNM       Current Outpatient Medications on File Prior to Encounter  Medication Sig Dispense Refill   celecoxib (CELEBREX) 200 MG capsule Take 1 capsule (200 mg total) by mouth 2 (two) times daily. 20 capsule 0   cyclobenzaprine (FLEXERIL) 10 MG tablet Take 1 tablet (10 mg total) by mouth 2 (two) times daily as needed for muscle spasms. 14 tablet 0   meloxicam (MOBIC) 15 MG tablet Take 1 tablet (15 mg total) by mouth daily. 30 tablet 0   methocarbamol (ROBAXIN) 500 MG tablet Take 1 tablet (500 mg total) by mouth 2 (two) times daily. 20  tablet 0   metroNIDAZOLE (FLAGYL) 500 MG tablet Take 1 tablet (500 mg total) by mouth 2 (two) times daily. 14 tablet 0   Allergies  Allergen Reactions   Oxycodone Hives    Also had a Rash    Latex Rash    I have reviewed patient's Past Medical Hx, Surgical Hx, Family Hx, Social Hx, medications and allergies.   ROS:  Review of Systems  Constitutional:  Negative for chills and fever.  Gastrointestinal:  Negative for abdominal pain.  Genitourinary:  Positive for vaginal bleeding. Negative for pelvic pain.  Neurological:  Positive for headaches.   Review of Systems  Other systems negative   Physical Exam  Physical Exam Patient Vitals for the past 24 hrs:  BP Temp Temp src Pulse Resp SpO2  02/21/22 2333 121/67 98.3 F (36.8 C) -- 63 18 100 %  02/21/22 1911 108/61 99.4 F (37.4 C) Oral 100 18 95 %   Constitutional: Well-developed, well-nourished female in no acute distress.  Cardiovascular: normal rate Respiratory: normal effort GI: Abd soft, non-tender. Pos BS x 4 MS: Extremities nontender, no edema, normal ROM Neurologic: Alert and oriented x 4.  GU: Neg CVAT.  PELVIC EXAM: Internal pelvic exam deferred inlieu of transvaginal ultrasound   Moderate blood on pad  LAB RESULTS Results for orders placed or performed during the hospital encounter of 02/21/22 (from the past 24 hour(s))  CBC     Status: Abnormal   Collection Time: 02/21/22  7:21 PM  Result Value Ref Range   WBC 6.7 4.0 - 10.5 K/uL   RBC 2.99 (L) 3.87 - 5.11 MIL/uL   Hemoglobin 8.9 (L) 12.0 - 15.0 g/dL   HCT 40.9 (L) 81.1 - 91.4 %   MCV 87.6 80.0 - 100.0 fL   MCH 29.8 26.0 - 34.0 pg   MCHC 34.0 30.0 - 36.0 g/dL   RDW 78.2 95.6 - 21.3 %   Platelets 297 150 - 400 K/uL   nRBC 0.0 0.0 - 0.2 %  Basic metabolic panel     Status: Abnormal   Collection Time: 02/21/22 11:20 PM  Result Value Ref Range   Sodium 136 135 - 145 mmol/L   Potassium 3.7 3.5 - 5.1 mmol/L   Chloride 105 98 - 111 mmol/L   CO2 24 22 -  32 mmol/L   Glucose, Bld 96 70 - 99 mg/dL   BUN 10 6 - 20 mg/dL   Creatinine, Ser 0.86 0.44 - 1.00 mg/dL   Calcium 8.3 (L) 8.9 - 10.3 mg/dL   GFR, Estimated >57 >84 mL/min   Anion gap 7 5 - 15  CBC     Status: Abnormal   Collection Time: 02/21/22 11:20 PM  Result Value Ref Range   WBC 7.1 4.0 - 10.5 K/uL   RBC  3.03 (L) 3.87 - 5.11 MIL/uL   Hemoglobin 8.9 (L) 12.0 - 15.0 g/dL   HCT 80.8 (L) 81.1 - 03.1 %   MCV 87.8 80.0 - 100.0 fL   MCH 29.4 26.0 - 34.0 pg   MCHC 33.5 30.0 - 36.0 g/dL   RDW 59.4 58.5 - 92.9 %   Platelets 282 150 - 400 K/uL   nRBC 0.0 0.0 - 0.2 %     IMAGING US OB Transvaginal  Result Date: 02/22/2022 CLINICAL DATA:  Four days post medication abortion. Heavy vaginal bleeding EXAM: TRANSVAGINAL OB ULTRASOUND TECHNIQUE: Transvaginal ultrasound was performed for complete evaluation of the gestation as well as the maternal uterus, adnexal regions, and pelvic cul-de-sac. COMPARISON:  None Available. FINDINGS: Intrauterine gestational sac: None Yolk sac:  Not Visualized. Embryo:  Not Visualized. Cardiac Activity: Not Visualized. Heart Rate:  bpm MSD:   mm    w     d CRL:     mm    w  d                  [place] EDC: Subchorionic hemorrhage:  None visualized. Maternal uterus/adnexae: Endometrium 23 mm in thickness, heterogeneous with areas of blood flow in the fundus. Findings concerning for retained products of conception. No free fluid. IMPRESSION: No intrauterine pregnancy. Thickened, heterogeneous endometrium with areas of blood flow concerning for retained products of conception. Electronically Signed   By: Charlett Nose M.D.   On: 02/22/2022 01:27     MAU Management/MDM: I have reviewed the triage vital signs and the nursing notes.   Pertinent labs & imaging results that were available during my care of the patient were reviewed by me and considered in my medical decision making (see chart for details).      I have reviewed her medical records including past results, notes and  treatments.   Consult Dr Macon Large with presentation, exam findings, and results.  She recommends manual vacuum extraction or D&C.  Patient prefers MVA.  Treatments in MAU included analgesia, IV fluids, and MVA procedure by Dr Macon Large.    ASSESSMENT 1. Vaginal bleeding   2.     Medical abortion complicated by Hemorrhage 3.     Acute blood loss anemia  PLAN Discharge home Bleeding precautions Rx Ibuprofen and Tramadol for pain at home Followup with GYN as scheduled  Pt stable at time of discharge. Encouraged to return here if she develops worsening of symptoms, increase in pain, fever, or other concerning symptoms.    Wynelle Bourgeois CNM, MSN Certified Nurse-Midwife 02/22/2022  12:26 AM

## 2022-02-22 NOTE — Procedures (Signed)
PREOPERATIVE DIAGNOSIS: Retained products of conception after first trimester medication termination complicated by hemorrhage POSTOPERATIVE DIAGNOSIS: The same PROCEDURE:  Manual Suction Dilation and Curettage  PROCEDURALIST:  Dr. Jaynie Collins  INDICATIONS: Alicia Morgan  is  a 31 y.o.  [id number] with retained products of conception after first trimester medication termination complicated by hemorrhage. Risks of procedure were discussed with the patient including but not limited to: bleeding which may require transfusion; infection which may require antibiotics; injury to uterus or surrounding organs; need for additional procedures; possibility of intrauterine scarring which may impair future fertility; and other postoperative complications. Written informed consent was obtained.  FINDINGS:   Moderate amount of products of conception.  ANESTHESIA:   Fentanyl  100 mcg IV and  Ativan 0.5 mg IV x 1 ESTIMATED BLOOD LOSS: 50 ml during procedure. SPECIMENS:  Products of conception sent to pathology COMPLICATIONS:  None immediate.  PROCEDURE DETAILS:  The patient received intravenous Fentanyl and Ativan for anesthesia and Doxycyline 200 mg po x 1 for infection prophylaxis.  After an adequate timeout was performed, she was placed in the dorsal lithotomy position.  A vaginal speculum was then placed in the patient's vagina, multiple clots were evacuated from her vagina.  The cervix was prepped with betadine x 3 and a ring forceps was applied to the anterior lip of the cervix.   The cervix was noted to be already dilated and was able to accommodate a 8 mm flexible suction curette that was gently advanced to the uterine fundus.  The manual suction device (Ipas Double Valve Aspirator) was then activated and curette slowly rotated to clear the uterus of products of conception. This was done twice times to completely evacuate the uterus. There was minimal bleeding noted at the end of the procedure, and the  ring forceps was removed with good hemostasis noted.   All instruments were removed from the patient's vagina.  Sponge and instrument counts were correct.   The patient tolerated the procedure well.  The patient will be monitored here in MAU, Toradol ordered for pain.  If stable, she will be discharged with appropriate oral analgesia and outpatient follow up.   Jaynie Collins, MD, FACOG Obstetrician & Gynecologist, [facility] for [employer], [facility]

## 2022-02-22 NOTE — Progress Notes (Signed)
Dr.Anyanwu, MD at bedside for Manual Suction Dilation and Curettage procedure.

## 2022-02-23 LAB — SURGICAL PATHOLOGY

## 2022-07-23 ENCOUNTER — Inpatient Hospital Stay (HOSPITAL_COMMUNITY)
Admission: EM | Admit: 2022-07-23 | Discharge: 2022-07-23 | Disposition: A | Discharge status: Home / Self Care | Payer: BLUE CROSS/BLUE SHIELD | Attending: Family Medicine | Admitting: Family Medicine

## 2022-07-23 ENCOUNTER — Encounter (HOSPITAL_COMMUNITY): Payer: Self-pay | Admitting: *Deleted

## 2022-07-23 ENCOUNTER — Other Ambulatory Visit: Payer: Self-pay

## 2022-07-23 DIAGNOSIS — N939 Abnormal uterine and vaginal bleeding, unspecified: Secondary | ICD-10-CM | POA: Diagnosis present

## 2022-07-23 LAB — COMPREHENSIVE METABOLIC PANEL
ALT: 23 U/L (ref 0–44)
AST: 26 U/L (ref 15–41)
Albumin: 3.8 g/dL (ref 3.5–5.0)
Alkaline Phosphatase: 51 U/L (ref 38–126)
Anion gap: 11 (ref 5–15)
BUN: 10 mg/dL (ref 6–20)
CO2: 23 mmol/L (ref 22–32)
Calcium: 8.8 mg/dL — ABNORMAL LOW (ref 8.9–10.3)
Chloride: 105 mmol/L (ref 98–111)
Creatinine, Ser: 0.78 mg/dL (ref 0.44–1.00)
GFR, Estimated: >60 mL/min (ref >60)
Glucose, Bld: 102 mg/dL — ABNORMAL HIGH (ref 70–99)
Potassium: 4.2 mmol/L (ref 3.5–5.1)
Sodium: 139 mmol/L (ref 135–145)
Total Bilirubin: 0.4 mg/dL (ref 0.3–1.2)
Total Protein: 7 g/dL (ref 6.5–8.1)

## 2022-07-23 LAB — CBC
HCT: 32.9 % — ABNORMAL LOW (ref 36.0–46.0)
Hemoglobin: 11 g/dL — ABNORMAL LOW (ref 12.0–15.0)
MCH: 29.3 pg (ref 26.0–34.0)
MCHC: 33.4 g/dL (ref 30.0–36.0)
MCV: 87.7 fL (ref 80.0–100.0)
Platelets: 352 10*3/uL (ref 150–400)
RBC: 3.75 MIL/uL — ABNORMAL LOW (ref 3.87–5.11)
RDW: 14.2 % (ref 11.5–15.5)
WBC: 5.9 10*3/uL (ref 4.0–10.5)
nRBC: 0 % (ref 0.0–0.2)

## 2022-07-23 LAB — WET PREP, GENITAL
Clue Cells Wet Prep HPF POC: NONE SEEN
Sperm: NONE SEEN
Trich, Wet Prep: NONE SEEN
WBC, Wet Prep HPF POC: <10 (ref <10)
Yeast Wet Prep HPF POC: NONE SEEN

## 2022-07-23 LAB — URINALYSIS, ROUTINE W REFLEX MICROSCOPIC
Bacteria, UA: NONE SEEN
Bilirubin Urine: NEGATIVE
Glucose, UA: NEGATIVE mg/dL
Ketones, ur: NEGATIVE mg/dL
Leukocytes,Ua: NEGATIVE
Nitrite: NEGATIVE
Protein, ur: NEGATIVE mg/dL
Specific Gravity, Urine: 1.011 (ref 1.005–1.030)
pH: 5 (ref 5.0–8.0)

## 2022-07-23 LAB — LIPASE, BLOOD: Lipase: 30 U/L (ref 11–51)

## 2022-07-23 LAB — POCT PREGNANCY, URINE: Preg Test, Ur: NEGATIVE

## 2022-07-23 NOTE — MAU Note (Addendum)
.  Alicia Morgan is a 31 y.o. at Unknown here in MAU reporting: that she had a period on 07/11/22 had sex and then her period came back on 07/19/22.  Pt states that her bleeding is different and she does not understand why she is bleeding x 2 in one month.  Pt reports that her bleeding is bright red with no odor.  Pt denies discharge.    LMP: 07/11/22 Onset of complaint: 07/19/22 Pain score: 8 Vitals:   07/23/22 0200 07/23/22 0201  BP:    Pulse:  81  Resp:    Temp:  98.8 F (37.1 C)  SpO2: 98%       Lab orders placed from triage:    UPT

## 2022-07-23 NOTE — ED Triage Notes (Signed)
The pt has had vaginal bleeding for onr week  her last normal period was on 12 3  she feels like the bleeding is not a normal period

## 2022-07-23 NOTE — ED Triage Notes (Signed)
[facility]  although I attempted to tell her that if she was not pregnant  she would get the same exam here  she was determined to go there and left

## 2022-07-23 NOTE — MAU Provider Note (Signed)
History     CSN: [id number]  Arrival date and time: 07/23/22 0103   None     Chief Complaint  Patient presents with   Vaginal Bleeding   HPI  [name] is a 31 y.o. female non pregnant here with bleeding. This is very abnormal for her. She presented to the Main ED and then came here on her own. She wants to know why she is bleeding. She had a normal period on 11/28 and then started bleeding again on 12/6. This has never happened to her. She recently had an abortion over the summer and wants to be sure everything is ok. She does not understand why she has had 2 periods this month and would like to find out tonight. She has a GYN appointment on 12/29.   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      History reviewed. No pertinent family history.  Social History   Tobacco Use   Smoking status: Former    Types: Cigarettes    Quit date: 08/2020    Years since quitting: 1.9   Smokeless tobacco: Never  Vaping Use   Vaping Use: Never used  Substance Use Topics   Alcohol use: Yes    Comment: occasionally   Drug use: No    Allergies:  Allergies  Allergen Reactions   Oxycodone Hives    Also had a Rash    Latex Rash    Medications Prior to Admission  Medication Sig Dispense Refill Last Dose   cetirizine (ZYRTEC) 10 MG tablet Take 10 mg by mouth daily.   Unknown   cyclobenzaprine (FLEXERIL) 10 MG tablet Take 1 tablet (10 mg total) by mouth 2 (two) times daily as needed for muscle spasms. 14 tablet 0  Unknown   ibuprofen (ADVIL) 600 MG tablet Take 1 tablet (600 mg total) by mouth every 6 (six) hours as needed. 30 tablet 1 Unknown   Multiple Vitamin (MULTIVITAMIN WITH MINERALS) TABS tablet Take 1 tablet by mouth daily.   Unknown   traMADol (ULTRAM) 50 MG tablet Take 1 tablet (50 mg total) by mouth every 6 (six) hours as needed. 15 tablet 0 Unknown   Results for orders placed or performed during the hospital encounter of 07/23/22 (from the past 48 hour(s))  Urinalysis, Routine w reflex microscopic Urine, Clean Catch     Status: Abnormal   Collection Time: 07/23/22  1:50 AM  Result Value Ref Range   Color, Urine YELLOW YELLOW   APPearance CLEAR CLEAR   Specific Gravity, Urine 1.011 1.005 - 1.030   pH 5.0 5.0 - 8.0   Glucose, UA NEGATIVE NEGATIVE mg/dL   Hgb urine dipstick  MODERATE (A) NEGATIVE   Bilirubin Urine NEGATIVE NEGATIVE   Ketones, ur NEGATIVE NEGATIVE mg/dL   Protein, ur NEGATIVE NEGATIVE mg/dL   Nitrite NEGATIVE NEGATIVE   Leukocytes,Ua NEGATIVE NEGATIVE   RBC / HPF 0-5 0 - 5 RBC/hpf   WBC, UA 0-5 0 - 5 WBC/hpf   Bacteria, UA NONE SEEN NONE SEEN   Squamous Epithelial / LPF 0-5 0 - 5    Comment: Performed at [facility], [address] [place], [place] [address]  Pregnancy, urine POC     Status: None   Collection Time: 07/23/22  2:06 AM  Result Value Ref Range   Preg Test, Ur NEGATIVE NEGATIVE    Comment:        THE SENSITIVITY OF THIS METHODOLOGY IS >24 mIU/mL   Lipase, blood     Status: None   Collection Time: 07/23/22  2:14 AM  Result Value Ref Range   Lipase 30 11 - 51 U/L    Comment: Performed at [facility], [address], [place], [place] [address]  Comprehensive metabolic panel     Status: Abnormal   Collection Time: 07/23/22  2:14 AM  Result Value Ref Range   Sodium 139 135 - 145 mmol/L   Potassium 4.2 3.5 - 5.1 mmol/L   Chloride 105 98 - 111 mmol/L   CO2 23 22 - 32 mmol/L   Glucose, Bld 102 (H) 70 - 99 mg/dL    Comment: Glucose  reference range applies only to samples taken after fasting for at least 8 hours.   BUN 10 6 - 20 mg/dL   Creatinine, Ser 1.57 0.44 - 1.00 mg/dL   Calcium 8.8 (L) 8.9 - 10.3 mg/dL   Total Protein 7.0 6.5 - 8.1 g/dL   Albumin 3.8 3.5 - 5.0 g/dL   AST 26 15 - 41 U/L   ALT 23 0 - 44 U/L   Alkaline Phosphatase 51 38 - 126 U/L   Total Bilirubin 0.4 0.3 - 1.2 mg/dL   GFR, Estimated >26 >20 mL/min    Comment: (NOTE) Calculated using the CKD-EPI Creatinine Equation (2021)    Anion gap 11 5 - 15    Comment: Performed at [facility], [address] [place], [place] [address]  CBC     Status: Abnormal   Collection Time: 07/23/22  2:14 AM  Result Value Ref Range   WBC 5.9 4.0 - 10.5 K/uL   RBC 3.75 (L) 3.87 - 5.11 MIL/uL   Hemoglobin 11.0 (L) 12.0 - 15.0 g/dL   HCT 41.6 (L) 38.4 - 53.6 %   MCV 87.7 80.0 - 100.0 fL   MCH 29.3 26.0 - 34.0 pg   MCHC 33.4 30.0 - 36.0 g/dL   RDW 46.8 03.2 - 12.2 %   Platelets 352 150 - 400 K/uL   nRBC 0.0 0.0 - 0.2 %    Comment: Performed at [facility], [address], [place], [place] [address]  Wet prep, genital     Status: None   Collection Time: 07/23/22  2:24 AM  Result Value Ref Range   Yeast Wet Prep HPF POC NONE SEEN NONE SEEN   Trich, Wet Prep NONE SEEN NONE SEEN   Clue Cells Wet Prep HPF POC NONE SEEN NONE SEEN   WBC, Wet Prep HPF POC <10 <10   Sperm NONE SEEN     Comment: Performed at [facility], [address] [place], [place] [address]  Review of Systems  Gastrointestinal:  Negative for abdominal pain.  Genitourinary:  Positive for vaginal bleeding.   Physical Exam   Blood pressure (!) 113/58, pulse 81, temperature 98.8 F (37.1 C), temperature source Oral, resp. rate 16, height 5\' 8"  (1.727 m), weight 113.7 kg, last menstrual period 07/16/2022, SpO2 98 %, unknown if currently breastfeeding.  Physical Exam Constitutional:      General: She is not in acute distress.    Appearance: Normal appearance. She is  not ill-appearing, toxic-appearing or diaphoretic.  Genitourinary:    Comments: Vagina - Scant amount of dark red blood.  Cervix - no active bleeding  Bimanual exam: Cervix closed Uterus non tender, normal size Adnexa non tender, no masses bilaterally GC/Chlam, wet prep done Chaperone present for exam.   Neurological:     Mental Status: She is alert.     MAU Course  Procedures  MDM  Initially offered a bimanual exam and patient declined.  Patient argumentative about plan of care toward nurse and provider. Patient is demanding a speculum exam and repeatedly asking why we are not going to figure out tonight why she is bleeding. She is requesting an ultrasound tonight to look in her uterus.  All labs are stable.   Assessment and Plan   A:   1. Vaginal bleeding, abnormal      P:  Dc home Return to MAU for emergencies only Keep your GYN visit on 12/29 Bleeding precautions  Quinnley Colasurdo, 1/30, NP 07/25/2022 1:52 PM

## 2022-07-23 NOTE — MAU Note (Signed)
RN observed pt crying.  When patient is asked what is wrong she continued to say "nothing".  Venia Carbon, NP addressed pt's concerns and was advised that she would be discharged.  Pt left MAU without signing discharge form.    Leonette Nutting

## 2022-07-24 LAB — GC/CHLAMYDIA PROBE AMP (~~LOC~~) NOT AT ARMC
Chlamydia: NEGATIVE
Comment: NEGATIVE
Comment: NORMAL
Neisseria Gonorrhea: NEGATIVE

## 2022-11-06 ENCOUNTER — Emergency Department (HOSPITAL_BASED_OUTPATIENT_CLINIC_OR_DEPARTMENT_OTHER): Payer: BC Managed Care – PPO

## 2022-11-06 ENCOUNTER — Emergency Department (HOSPITAL_BASED_OUTPATIENT_CLINIC_OR_DEPARTMENT_OTHER)
Admission: EM | Admit: 2022-11-06 | Discharge: 2022-11-06 | Disposition: A | Discharge status: Home / Self Care | Payer: BC Managed Care – PPO | Attending: Emergency Medicine | Admitting: Emergency Medicine

## 2022-11-06 ENCOUNTER — Encounter (HOSPITAL_BASED_OUTPATIENT_CLINIC_OR_DEPARTMENT_OTHER): Payer: Self-pay | Admitting: Urology

## 2022-11-06 DIAGNOSIS — R0789 Other chest pain: Secondary | ICD-10-CM | POA: Diagnosis not present

## 2022-11-06 DIAGNOSIS — Z91040 Latex allergy status: Secondary | ICD-10-CM | POA: Insufficient documentation

## 2022-11-06 DIAGNOSIS — I10 Essential (primary) hypertension: Secondary | ICD-10-CM | POA: Insufficient documentation

## 2022-11-06 LAB — BASIC METABOLIC PANEL
Anion gap: 11 (ref 5–15)
BUN: 13 mg/dL (ref 6–20)
CO2: 23 mmol/L (ref 22–32)
Calcium: 8.7 mg/dL — ABNORMAL LOW (ref 8.9–10.3)
Chloride: 99 mmol/L (ref 98–111)
Creatinine, Ser: 0.86 mg/dL (ref 0.44–1.00)
GFR, Estimated: >60 mL/min (ref >60)
Glucose, Bld: 76 mg/dL (ref 70–99)
Potassium: 3.9 mmol/L (ref 3.5–5.1)
Sodium: 133 mmol/L — ABNORMAL LOW (ref 135–145)

## 2022-11-06 LAB — CBC
HCT: 36.4 % (ref 36.0–46.0)
Hemoglobin: 12 g/dL (ref 12.0–15.0)
MCH: 29.3 pg (ref 26.0–34.0)
MCHC: 33 g/dL (ref 30.0–36.0)
MCV: 88.8 fL (ref 80.0–100.0)
Platelets: 375 10*3/uL (ref 150–400)
RBC: 4.1 MIL/uL (ref 3.87–5.11)
RDW: 13.1 % (ref 11.5–15.5)
WBC: 6.4 10*3/uL (ref 4.0–10.5)
nRBC: 0 % (ref 0.0–0.2)

## 2022-11-06 LAB — TROPONIN I (HIGH SENSITIVITY): Troponin I (High Sensitivity): <2 ng/L (ref <18)

## 2022-11-06 NOTE — ED Triage Notes (Signed)
Pt states spasm type pain underneath left breast that started yesterday and comes and goes  Pain worse with movement and bending   Pt on Wygovy since January, gastric bypass in 2022

## 2022-11-06 NOTE — ED Provider Notes (Signed)
[facility] Provider Note   CSN: [id number] Arrival date & time: 11/06/22  1235     History  Chief Complaint  Patient presents with   Chest Wall Pain     Alicia Morgan is a 32 y.o. female past medical history of anemia presents today for evaluation of chest pain.  Patient states she started to have chest pain last night.  Pain is pressure-like, located in the left side of her chest, nonradiating, intermittent.  States chest pain lasted for few seconds when it comes on.  Any fever, headache, dizziness, shortness of breath, extremity weakness or numbness, diaphoresis.  No recent travel or surgery.  Denies any personal or family history of PE/DVT.  Denies taking OCP.  Denies any cough, runny nose, acid reflux symptoms.  Denies any leg swelling or pain.  HPI    Past Medical History:  Diagnosis Date   Anemia    Chlamydia    Depression    Gestational hypertension 02/03/2015   Headache    Obesity   \  Home Medications Prior to Admission medications   Medication Sig Start Date End Date Taking? Authorizing Provider  cetirizine (ZYRTEC) 10 MG tablet Take 10 mg by mouth daily.    [provider]  cyclobenzaprine (FLEXERIL) 10 MG tablet Take 1 tablet (10 mg total) by mouth 2 (two) times daily as needed for muscle spasms. 05/06/19   Robinson, [place] N, PA-C  ibuprofen (ADVIL) 600 MG tablet Take 1 tablet (600 mg total) by mouth every 6 (six) hours as needed. 02/22/22   Seabron Spates, CNM  Multiple Vitamin (MULTIVITAMIN WITH MINERALS) TABS tablet Take 1 tablet by mouth daily.    [provider]  traMADol (ULTRAM) 50 MG tablet Take 1 tablet (50 mg total) by mouth every 6 (six) hours as needed. 02/22/22   Seabron Spates, CNM      Allergies    Oxycodone and Latex    Review of Systems   Review of Systems Negative except as per HPI.  Physical Exam Updated Vital Signs BP 121/69 (BP Location: Right Arm)   Pulse 71    Temp 97.9 F (36.6 C) (Oral)   Resp 17   Ht 5\' 8"  (1.727 m)   Wt 113.7 kg   LMP 10/13/2022 (Approximate)   SpO2 98%   BMI 38.11 kg/m  Physical Exam Vitals and nursing note reviewed.  Constitutional:      Appearance: Normal appearance.  HENT:     Head: Normocephalic and atraumatic.     Mouth/Throat:     Mouth: Mucous membranes are moist.  Eyes:     General: No scleral icterus. Cardiovascular:     Rate and Rhythm: Normal rate and regular rhythm.     Pulses: Normal pulses.     Heart sounds: Normal heart sounds.  Pulmonary:     Effort: Pulmonary effort is normal.     Breath sounds: Normal breath sounds.  Abdominal:     General: Abdomen is flat.     Palpations: Abdomen is soft.     Tenderness: There is no abdominal tenderness.  Musculoskeletal:        General: No deformity.  Skin:    General: Skin is warm.     Findings: No rash.  Neurological:     General: No focal deficit present.     Mental Status: She is alert.  Psychiatric:        Mood and Affect: Mood normal.  ED Results / Procedures / Treatments   Labs (all labs ordered are listed, but only abnormal results are displayed) Labs Reviewed  BASIC METABOLIC PANEL - Abnormal; Notable for the following components:      Result Value   Sodium 133 (*)    Calcium 8.7 (*)    All other components within normal limits  CBC  PREGNANCY, URINE  TROPONIN I (HIGH SENSITIVITY)  TROPONIN I (HIGH SENSITIVITY)    EKG EKG Interpretation  Date/Time:  Monday November 06 2022 13:09:23 EDT Ventricular Rate:  76 PR Interval:  131 QRS Duration: 101 QT Interval:  364 QTC Calculation: 410 R Axis:   79 Text Interpretation: Sinus rhythm Nonspecific T abnormalities, anterior leads Similar to prior Confirmed by Nanda Quinton [phone]) on 11/06/2022 1:14:27 PM  Radiology DG Chest 2 View  Result Date: 11/06/2022 CLINICAL DATA:  Chest pain. EXAM: CHEST - 2 VIEW COMPARISON:  Chest x-ray a June 28, 21. FINDINGS: The heart size and  mediastinal contours are within normal limits. Both lungs are clear. No visible pleural effusions or pneumothorax. No acute osseous abnormality. IMPRESSION: No active cardiopulmonary disease. Electronically Signed   By: Margaretha Sheffield M.D.   On: 11/06/2022 13:25    Procedures Procedures    Medications Ordered in ED Medications - No data to display  ED Course/ Medical Decision Making/ A&P                             Medical Decision Making Amount and/or Complexity of Data Reviewed Labs: ordered. Radiology: ordered.   This patient presents to the ED for chest pain, this involves an extensive number of treatment options, and is a complaint that carries with a high risk of complications and morbidity.  The differential diagnosis includes ACS, pericarditis, PE, pneumothorax, pneumonia, less likely dissection with essentially normal blood pressure, symmetric bilateral pulses, and no back pain.  This is not an exhaustive list.  Lab tests: I ordered and personally interpreted labs.  The pertinent results include: WBC unremarkable. Hbg unremarkable. Platelets unremarkable. Electrolytes unremarkable. BUN, creatinine unremarkable.  Troponin negative.  Imaging studies: I ordered imaging studies. I personally reviewed, interpreted imaging and agree with the radiologist's interpretations. The results include: X-ray negative.  Problem list/ ED course/ Critical interventions/ Medical management: HPI: See above Vital signs within normal range and stable throughout visit. Laboratory/imaging studies significant for: See above. On physical examination, patient is afebrile and appears in no acute distress. Exam without evidence of volume overload so doubt heart failure. EKG without signs of active ischemia. Given the timing of pain to ER presentation, single troponin was negative so doubt NSTEMI. Presentation not consistent with acute PE (PERC negative), pneumothorax (not visualized on chest xr),  thoracic aortic dissection, pericarditis, tamponade, pneumonia (no infectious symptoms, clear chest xr), myocarditis (no recent illness, neg trop).  Patient's symptoms improved during her visit in the ER.  HEART score: 1 so plan to discharge patient home with PCP follow up. I have reviewed the patient home medicines and have made adjustments as needed.  Cardiac monitoring/EKG: The patient was maintained on a cardiac monitor.  I personally reviewed and interpreted the cardiac monitor which showed an underlying rhythm of: sinus rhythm.  Additional history obtained: External records from outside source obtained and reviewed including: Chart review including previous notes, labs, imaging.  Consultations obtained:  Disposition Continued outpatient therapy. Follow-up with PCP recommended for reevaluation of symptoms. Treatment plan discussed with patient.  Pt  acknowledged understanding was agreeable to the plan. Worrisome signs and symptoms were discussed with patient, and patient acknowledged understanding to return to the ED if they noticed these signs and symptoms. Patient was stable upon discharge.   This chart was dictated using voice recognition software.  Despite best efforts to proofread,  errors can occur which can change the documentation meaning.          Final Clinical Impression(s) / ED Diagnoses Final diagnoses:  Chest pain, atypical    Rx / DC Orders ED Discharge Orders     None         Rex Kras, [place] 11/06/22 1431    Margette Fast, MD 11/11/22 1045

## 2022-11-06 NOTE — Discharge Instructions (Addendum)
Your workup was reassuring today including labs, EKG, and chest x-ray. Please take tylenol/ibuprofen for pain. I recommend close follow-up with PCP for reevaluation.  Please do not hesitate to return to emergency department if worrisome signs symptoms we discussed become apparent.

## 2022-12-05 ENCOUNTER — Other Ambulatory Visit: Payer: Self-pay

## 2022-12-05 ENCOUNTER — Encounter (HOSPITAL_BASED_OUTPATIENT_CLINIC_OR_DEPARTMENT_OTHER): Payer: Self-pay

## 2022-12-05 ENCOUNTER — Emergency Department (HOSPITAL_BASED_OUTPATIENT_CLINIC_OR_DEPARTMENT_OTHER)
Admission: EM | Admit: 2022-12-05 | Discharge: 2022-12-05 | Disposition: A | Discharge status: Home / Self Care | Payer: BC Managed Care – PPO | Attending: Emergency Medicine | Admitting: Emergency Medicine

## 2022-12-05 DIAGNOSIS — Z91040 Latex allergy status: Secondary | ICD-10-CM | POA: Diagnosis not present

## 2022-12-05 DIAGNOSIS — R040 Epistaxis: Secondary | ICD-10-CM | POA: Insufficient documentation

## 2022-12-05 NOTE — Discharge Instructions (Addendum)
Thank you for allowing me to be a part of your care today.    I have attached instructions about nose bleeds and what to do if you experience another nose bleed.  If you follow these instructions, but are still having a nose bleed, use Afrin nose spray to help slow and stop the bleeding.   I encourage you to use a humidifier at night time and use Vaseline in your nostrils to help maintain moisture.   I have included referral information for ENT.  I recommend following up with them.   Return to the ED if you experience worsening of your symptoms, have a nose bleed that will not stop, or if you have any new concerns.

## 2022-12-05 NOTE — ED Notes (Signed)
Snacks and drink offered to pt.

## 2022-12-05 NOTE — ED Provider Notes (Signed)
[facility] Provider Note   CSN: [id number] Arrival date & time: 12/05/22  2109     History  Chief Complaint  Patient presents with   Epistaxis    Alicia Morgan is a 32 y.o. female presents to the ED complaining of a nose bleed that began earlier today.  She reports that her nose bled for approximately 2 hours.  She attempted to get the bleeding to stop by blowing her nose, but it did not help.  Her epistaxis started after she sneezed and blew her nose.  She is not currently having bleeding, but is concerned because she has never had a nose bleed before.  She does not take blood thinners.  Denies congestion, rhinorrhea, syncope, headache, lightheadedness.         Home Medications Prior to Admission medications   Medication Sig Start Date End Date Taking? Authorizing Provider  cetirizine (ZYRTEC) 10 MG tablet Take 10 mg by mouth daily.    [provider]  cyclobenzaprine (FLEXERIL) 10 MG tablet Take 1 tablet (10 mg total) by mouth 2 (two) times daily as needed for muscle spasms. 05/06/19   Robinson, [place] N, PA-C  ibuprofen (ADVIL) 600 MG tablet Take 1 tablet (600 mg total) by mouth every 6 (six) hours as needed. 02/22/22   Aviva Signs, CNM  Multiple Vitamin (MULTIVITAMIN WITH MINERALS) TABS tablet Take 1 tablet by mouth daily.    [provider]  traMADol (ULTRAM) 50 MG tablet Take 1 tablet (50 mg total) by mouth every 6 (six) hours as needed. 02/22/22   Aviva Signs, CNM      Allergies    Oxycodone and Latex    Review of Systems   Review of Systems  HENT:  Positive for nosebleeds. Negative for congestion and rhinorrhea.   Neurological:  Negative for syncope, light-headedness and headaches.    Physical Exam Updated Vital Signs BP 131/86 (BP Location: Left Arm)   Pulse 77   Temp 98 F (36.7 C)   Resp 18   Ht  (1.727 m)   Wt 103 kg   LMP 11/13/2022 (Approximate)   SpO2 99%   BMI  34.52 kg/m  Physical Exam Vitals and nursing note reviewed.  Constitutional:      General: She is not in acute distress.    Appearance: Normal appearance. She is not ill-appearing or diaphoretic.  HENT:     Nose: No signs of injury, nasal tenderness, mucosal edema, congestion or rhinorrhea.     Right Nostril: No epistaxis.     Left Nostril: No epistaxis.     Comments: No active epistaxis.  No dried blood in or around the nose.  There is an area anteriorly in left nare that is erythematous and irritated which may be site of bleed.   Cardiovascular:     Rate and Rhythm: Normal rate and regular rhythm.  Pulmonary:     Effort: Pulmonary effort is normal.  Neurological:     Mental Status: She is alert. Mental status is at baseline.  Psychiatric:        Mood and Affect: Mood normal.        Behavior: Behavior normal.     ED Results / Procedures / Treatments   Labs (all labs ordered are listed, but only abnormal results are displayed) Labs Reviewed - No data to display  EKG None  Radiology No results found.  Procedures Procedures    Medications Ordered in ED  Medications - No data to display  ED Course/ Medical Decision Making/ A&P                             Medical Decision Making  Patient presents to the ED complaining of a nosebleed that lasted approximately 2 hours.  Patient was given instructions by coworkers on how to stop the bleed, but this did not help.  She had her head tilted back and was coughing up blood.  She also continued to blow her nose thinking it would help.  Patient has never had a nosebleed, so she was concerned.  Her and her family have had congestion and cold-like symptoms recently.   Exam significant for an erythematous and irritated area anteriorly in the left nare, likely the site of the bleed.  No dried blood in or around the nose.  No active epistaxis.  Mucous membranes are moist.   Discussed with patient proper procedure for nosebleeds and  educated her on how to stop the bleed.  Patient was given information in writing.  Also discussed supportive care measures to help prevent epistaxis including keeping nasal passages moist.  Patient given referral information for ENT.   The patient has been appropriately medically screened and/or stabilized in the ED. I have low suspicion for any other emergent medical condition which would require further screening, evaluation or treatment in the ED or require inpatient management. At time of discharge the patient is hemodynamically stable and in no acute distress. I have discussed work-up results and diagnosis with patient and answered all questions. Patient is agreeable with discharge plan. We discussed strict return precautions for returning to the emergency department and they verbalized understanding.          Final Clinical Impression(s) / ED Diagnoses Final diagnoses:  Left-sided epistaxis    Rx / DC Orders ED Discharge Orders     None         Lenard Simmer, PA-C 12/05/22 2326    Alvira Monday, MD 12/07/22 2110

## 2022-12-05 NOTE — ED Notes (Signed)
Nosebleed after blowing nose, no active bleeding at this time

## 2022-12-05 NOTE — ED Triage Notes (Signed)
Pt arrives with c/o nosebleed. Bleeding has stopped at this time. Per pt, she blew her nose and it started to bleed.

## 2022-12-19 ENCOUNTER — Emergency Department (HOSPITAL_BASED_OUTPATIENT_CLINIC_OR_DEPARTMENT_OTHER)
Admission: EM | Admit: 2022-12-19 | Discharge: 2022-12-19 | Disposition: A | Discharge status: Home / Self Care | Payer: BC Managed Care – PPO | Attending: Emergency Medicine | Admitting: Emergency Medicine

## 2022-12-19 ENCOUNTER — Encounter (HOSPITAL_BASED_OUTPATIENT_CLINIC_OR_DEPARTMENT_OTHER): Payer: Self-pay

## 2022-12-19 DIAGNOSIS — Z202 Contact with and (suspected) exposure to infections with a predominantly sexual mode of transmission: Secondary | ICD-10-CM | POA: Insufficient documentation

## 2022-12-19 DIAGNOSIS — Z91040 Latex allergy status: Secondary | ICD-10-CM | POA: Insufficient documentation

## 2022-12-19 DIAGNOSIS — N898 Other specified noninflammatory disorders of vagina: Secondary | ICD-10-CM | POA: Insufficient documentation

## 2022-12-19 LAB — HIV ANTIBODY (ROUTINE TESTING W REFLEX): HIV Screen 4th Generation wRfx: NONREACTIVE

## 2022-12-19 LAB — URINALYSIS, ROUTINE W REFLEX MICROSCOPIC
Glucose, UA: NEGATIVE mg/dL
Hgb urine dipstick: NEGATIVE
Ketones, ur: 40 mg/dL — AB
Leukocytes,Ua: NEGATIVE
Nitrite: NEGATIVE
Protein, ur: 30 mg/dL — AB
Specific Gravity, Urine: >=1.03 (ref 1.005–1.030)
pH: 5.5 (ref 5.0–8.0)

## 2022-12-19 LAB — RPR: RPR Ser Ql: NONREACTIVE

## 2022-12-19 LAB — WET PREP, GENITAL
Clue Cells Wet Prep HPF POC: NONE SEEN
Sperm: NONE SEEN
Trich, Wet Prep: NONE SEEN
WBC, Wet Prep HPF POC: <10 (ref <10)
Yeast Wet Prep HPF POC: NONE SEEN

## 2022-12-19 LAB — URINALYSIS, MICROSCOPIC (REFLEX)

## 2022-12-19 LAB — PREGNANCY, URINE: Preg Test, Ur: NEGATIVE

## 2022-12-19 MED ORDER — LIDOCAINE HCL (PF) 1 % IJ SOLN
INTRAMUSCULAR | Status: AC
  Administered 2022-12-19: 5 mL
  Filled 2022-12-19: qty 5

## 2022-12-19 MED ORDER — CEFTRIAXONE SODIUM 500 MG IJ SOLR
500.0000 mg | Freq: Once | INTRAMUSCULAR | Status: AC
  Administered 2022-12-19: 500 mg via INTRAMUSCULAR
  Filled 2022-12-19: qty 500

## 2022-12-19 MED ORDER — DOXYCYCLINE HYCLATE 100 MG PO CAPS: 100.0000 mg | ORAL_CAPSULE | Freq: Two times a day (BID) | ORAL | 0 refills | Status: DC

## 2022-12-19 NOTE — Discharge Instructions (Signed)
Please read and follow all provided instructions.  Your diagnoses today include:  1. Vaginal itching   2. Possible exposure to STI     Tests performed today include: Test for gonorrhea and chlamydia.  Pregnancy test was negative Urine test did not show sign of infection Test for HIV and syphilis.  You will be notified by telephone with any positive results.   Vital signs. See below for your results today.   Medications:  For treatment of gonorrhea: You were treated with a rocephin (shot) today.   For treatment of chlamydia: Please hold the prescription for doxycycline until the results return.  If you test positive for chlamydia you should fill the doxycycline and take the entire prescription.  If you test negative, you can discard the prescription. You can check your results on the internet through your MyChart. Results typically return in 48 hours.   Home care instructions:  Read educational materials contained in this packet and follow any instructions provided.   You should tell your partners about your infection and avoid having sex for one week to allow time for the medicine to work.  Sexually transmitted disease testing also available at:  [facility] [place], [facility] [address], [place], phone [phone] or [phone]   Monday - Friday, call for an appointment  Return instructions:  Please return to the Emergency Department if you experience worsening symptoms.  Please return if you have any other emergent concerns.  Additional Information:  Your vital signs today were: BP 126/62 (BP Location: Left Arm)   Pulse 92   Temp 98.3 F (36.8 C) (Oral)   Resp 18   Ht 5\' 8"  (1.727 m)   Wt 98.9 kg   LMP 11/13/2022 (Approximate)   SpO2 98%   BMI 33.15 kg/m  If your blood pressure (BP) was elevated above 135/85 this visit, please have this repeated by your doctor within one month. --------------

## 2022-12-19 NOTE — ED Provider Notes (Signed)
[name] Provider Note   CSN: [id number] Arrival date & time: 12/19/22  0840     History  Chief Complaint  Patient presents with   Vaginal Pain    Alicia Morgan is a 32 y.o. female.  Patient presents to the emergency department for vaginal irritation.  Patient states that 4 days ago she had sexual intercourse.  She was using a latex condom which has caused her vaginitis type symptoms in the past.  She reports that the condom broke and she is uncertain if her partner has been tested recently.  She is requesting testing and treatment today.  Denies any urinary symptoms.  She does not think that she is pregnant.      Home Medications Prior to Admission medications   Medication Sig Start Date End Date Taking? Authorizing Provider  cetirizine (ZYRTEC) 10 MG tablet Take 10 mg by mouth daily.    [provider]  cyclobenzaprine (FLEXERIL) 10 MG tablet Take 1 tablet (10 mg total) by mouth 2 (two) times daily as needed for muscle spasms. 05/06/19   Robinson, [place] N, PA-C  ibuprofen (ADVIL) 600 MG tablet Take 1 tablet (600 mg total) by mouth every 6 (six) hours as needed. 02/22/22   Aviva Signs, CNM  Multiple Vitamin (MULTIVITAMIN WITH MINERALS) TABS tablet Take 1 tablet by mouth daily.    [provider]  traMADol (ULTRAM) 50 MG tablet Take 1 tablet (50 mg total) by mouth every 6 (six) hours as needed. 02/22/22   Aviva Signs, CNM      Allergies    Oxycodone and Latex    Review of Systems   Review of Systems  Physical Exam Updated Vital Signs BP 126/62 (BP Location: Left Arm)   Pulse 92   Temp 98.3 F (36.8 C) (Oral)   Resp 18   Ht 5\' 8"  (1.727 m)   Wt 98.9 kg   LMP 11/13/2022 (Approximate)   SpO2 98%   BMI 33.15 kg/m   Physical Exam Vitals and nursing note reviewed. Exam conducted with a chaperone present.  Constitutional:      General: She is not in acute distress.    Appearance: She  is well-developed.  HENT:     Head: Normocephalic and atraumatic.     Right Ear: External ear normal.     Left Ear: External ear normal.     Nose: Nose normal.  Eyes:     Conjunctiva/sclera: Conjunctivae normal.  Cardiovascular:     Rate and Rhythm: Normal rate and regular rhythm.     Heart sounds: No murmur heard. Pulmonary:     Effort: No respiratory distress.     Breath sounds: No wheezing, rhonchi or rales.  Abdominal:     Palpations: Abdomen is soft.     Tenderness: There is no abdominal tenderness. There is no guarding or rebound.  Genitourinary:    Exam position: Lithotomy position.     Labia:        Right: Tenderness present. No rash.        Left: Tenderness present. No rash.      Vagina: Vaginal discharge (Thin white) present.     Cervix: Normal.     Uterus: Not tender.      Adnexa:        Right: No mass or tenderness.         Left: No mass or tenderness.    Musculoskeletal:     Cervical back:  Normal range of motion and neck supple.     Right lower leg: No edema.     Left lower leg: No edema.  Skin:    General: Skin is warm and dry.     Findings: No rash.  Neurological:     General: No focal deficit present.     Mental Status: She is alert. Mental status is at baseline.     Motor: No weakness.  Psychiatric:        Mood and Affect: Mood normal.     ED Results / Procedures / Treatments   Labs (all labs ordered are listed, but only abnormal results are displayed) Labs Reviewed  URINALYSIS, ROUTINE W REFLEX MICROSCOPIC - Abnormal; Notable for the following components:      Result Value   Color, Urine AMBER (*)    Bilirubin Urine SMALL (*)    Ketones, ur 40 (*)    Protein, ur 30 (*)    All other components within normal limits  URINALYSIS, MICROSCOPIC (REFLEX) - Abnormal; Notable for the following components:   Bacteria, UA RARE (*)    All other components within normal limits  WET PREP, GENITAL  PREGNANCY, URINE  RPR  HIV ANTIBODY (ROUTINE TESTING W  REFLEX)  GC/CHLAMYDIA PROBE AMP ([place]) NOT AT [facility]    EKG None  Radiology No results found.  Procedures Procedures    Medications Ordered in ED Medications  cefTRIAXone (ROCEPHIN) injection 500 mg (500 mg Intramuscular Given 12/19/22 1047)  lidocaine (PF) (XYLOCAINE) 1 % injection (5 mLs  Given 12/19/22 1051)    ED Course/ Medical Decision Making/ A&P    Patient seen and examined. History obtained directly from patient.   Labs/EKG: Ordered UA, urine pregnant, GC chlamydia, wet prep.  Imaging: None ordered  Medications/Fluids: None ordered  Most recent vital signs reviewed and are as follows: BP 126/62 (BP Location: Left Arm)   Pulse 92   Temp 98.3 F (36.8 C) (Oral)   Resp 18   Ht 5\' 8"  (1.727 m)   Wt 98.9 kg   LMP 11/13/2022 (Approximate)   SpO2 98%   BMI 33.15 kg/m   Initial impression: STI screening  10:09 AM pelvic exam performed with RN chaperone.  Wet prep pending.  10:52 AM Reassessment performed. Patient appears stable.  Labs personally reviewed and interpreted including: Wet prep negative, UA without compelling signs of infection, pregnancy negative.  Reviewed pertinent lab work and imaging with patient at bedside. Questions answered.   Most current vital signs reviewed and are as follows: BP 126/62 (BP Location: Left Arm)   Pulse 92   Temp 98.3 F (36.8 C) (Oral)   Resp 18   Ht 5\' 8"  (1.727 m)   Wt 98.9 kg   LMP 11/13/2022 (Approximate)   SpO2 98%   BMI 33.15 kg/m   Plan: Discharge to home.  Patient requesting dose of IM Rocephin here to treat for possible sexual transmitted infection exposure.  Prescriptions written for: Doxycycline, patient instructed to take this if STI testing positive.  She will check for results in MyChart in the next 48 hours.  Other home care instructions discussed: Patient counseled on safe sexual practices, encouraged them to avoid sexual contact for 7 days and to inform sexual partners so that they can get  tested and treated as well. Patient verbalizes understanding and agrees with plan.    ED return instructions discussed: New or worsening symptoms  Follow-up instructions discussed: Patient encouraged to follow-up with their PCP as  needed.                             Medical Decision Making Amount and/or Complexity of Data Reviewed Labs: ordered.  Risk Prescription drug management.   Patient with vaginitis type symptoms, requests to be tested for sexually transmitted infection today.  Pelvic exam with discharge as described above.  Wet prep was negative.  GC, chlamydia, RPR, HIV pending.  Patient was given dose of IM Rocephin at her request.  Discharged home with doxycycline.  Low concern for PID, sepsis.        Final Clinical Impression(s) / ED Diagnoses Final diagnoses:  Vaginal itching  Possible exposure to STI    Rx / DC Orders ED Discharge Orders     None         Renne Crigler, PA-C 12/19/22 1054    Gwyneth Sprout, MD 12/19/22 1408

## 2022-12-19 NOTE — ED Triage Notes (Signed)
States contraceptive broke during intercourse. Wants to be tested "for everything".   States allergic to latex and partner used latex condom, now having vaginal irration.

## 2022-12-20 LAB — GC/CHLAMYDIA PROBE AMP (~~LOC~~) NOT AT ARMC
Chlamydia: NEGATIVE
Comment: NEGATIVE
Comment: NORMAL
Neisseria Gonorrhea: NEGATIVE

## 2023-11-20 DIAGNOSIS — N898 Other specified noninflammatory disorders of vagina: Secondary | ICD-10-CM | POA: Insufficient documentation

## 2023-11-20 DIAGNOSIS — Z91040 Latex allergy status: Secondary | ICD-10-CM | POA: Diagnosis not present

## 2023-11-21 ENCOUNTER — Emergency Department (HOSPITAL_BASED_OUTPATIENT_CLINIC_OR_DEPARTMENT_OTHER)
Admission: EM | Admit: 2023-11-21 | Discharge: 2023-11-21 | Disposition: A | Discharge status: Home / Self Care | Attending: Emergency Medicine | Admitting: Emergency Medicine

## 2023-11-21 ENCOUNTER — Encounter (HOSPITAL_BASED_OUTPATIENT_CLINIC_OR_DEPARTMENT_OTHER): Payer: Self-pay | Admitting: Emergency Medicine

## 2023-11-21 ENCOUNTER — Other Ambulatory Visit: Payer: Self-pay

## 2023-11-21 DIAGNOSIS — N898 Other specified noninflammatory disorders of vagina: Secondary | ICD-10-CM

## 2023-11-21 LAB — URINALYSIS, ROUTINE W REFLEX MICROSCOPIC
Bilirubin Urine: NEGATIVE
Glucose, UA: NEGATIVE mg/dL
Hgb urine dipstick: NEGATIVE
Ketones, ur: NEGATIVE mg/dL
Leukocytes,Ua: NEGATIVE
Nitrite: NEGATIVE
Protein, ur: NEGATIVE mg/dL
Specific Gravity, Urine: >=1.03 (ref 1.005–1.030)
pH: 6 (ref 5.0–8.0)

## 2023-11-21 LAB — GC/CHLAMYDIA PROBE AMP (~~LOC~~) NOT AT ARMC
Chlamydia: NEGATIVE
Comment: NEGATIVE
Comment: NORMAL
Neisseria Gonorrhea: NEGATIVE

## 2023-11-21 LAB — WET PREP, GENITAL
Clue Cells Wet Prep HPF POC: NONE SEEN
Sperm: NONE SEEN
Trich, Wet Prep: NONE SEEN
WBC, Wet Prep HPF POC: <10 (ref <10)
Yeast Wet Prep HPF POC: NONE SEEN

## 2023-11-21 LAB — PREGNANCY, URINE: Preg Test, Ur: NEGATIVE

## 2023-11-21 NOTE — ED Provider Notes (Signed)
 I see no signs of STI on exam, urine or wet prep.  GC and chlamydia were sent and are pending.  Patient also wants to know about cheeks swabs and other forms of STI testing.  Nurse and EDP stated we do not do that form of testing in the ED.  GYN and or county health department may do those tests but we have never done those tests.  Patient is now nervous that condom may have gone higher than can be seen.  I explained that condom could not get through cervical os. I spoke to patient at length about the fact I did multiple passes with the speculum in addition to the bimanual and there was no foreign body.  I explained that in a period of 9 days it may have fallen out when using the bathroom or may have not been retained.  I explained the wet prep was very benign and no signs of infection and that GC and chlamydia were pending.     Kastiel Simonian, MD 11/21/23 [phone]

## 2023-11-21 NOTE — ED Triage Notes (Addendum)
 Pt reports having sexual intercourse a week ago yesterday, (last Tues) and did not see the condom after sex.  Pt reports she is now having "clumpy" white discharge and is concerned the condom remains. No urinary symptoms or pain

## 2023-11-21 NOTE — ED Provider Notes (Signed)
  [facility] Provider Note   CSN: [id number] Arrival date & time: 11/20/23  2355     History  Chief Complaint  Patient presents with   Foreign Body in Vagina    Alicia Morgan is a 33 y.o. female.  The history is provided by the patient.  Foreign Body in Vagina This is a new problem. The current episode started more than 1 week ago. The problem occurs constantly. The problem has not changed since onset.Pertinent negatives include no chest pain, no abdominal pain, no headaches and no shortness of breath. Nothing aggravates the symptoms. Nothing relieves the symptoms. She has tried nothing for the symptoms. The treatment provided no relief.  Patient reports intercourse 9 days ago and thinks condom which came off during intercourse may be retained.  States she has experienced vaginal dryness as well.       Home Medications Prior to Admission medications   Medication Sig Start Date End Date Taking? Authorizing Provider  cetirizine (ZYRTEC) 10 MG tablet Take 10 mg by mouth daily.    [provider]  doxycycline (VIBRAMYCIN) 100 MG capsule Take 1 capsule (100 mg total) by mouth 2 (two) times daily. 12/19/22   Renne Crigler, PA-C  Multiple Vitamin (MULTIVITAMIN WITH MINERALS) TABS tablet Take 1 tablet by mouth daily.    [provider]  traMADol (ULTRAM) 50 MG tablet Take 1 tablet (50 mg total) by mouth every 6 (six) hours as needed. 02/22/22   Aviva Signs, CNM      Allergies    Oxycodone and Latex    Review of Systems   Review of Systems  Constitutional:  Negative for fever.  Respiratory:  Negative for shortness of breath.   Cardiovascular:  Negative for chest pain.  Gastrointestinal:  Negative for abdominal pain.  Neurological:  Negative for headaches.  All other systems reviewed and are negative.   Physical Exam Updated Vital Signs BP (!) 131/92 (BP Location: Left Arm)   Pulse 89   Temp 97.8 F  (36.6 C) (Oral)   Resp 18   SpO2 100%  Physical Exam Vitals and nursing note reviewed. Exam conducted with a chaperone present.  Constitutional:      General: She is not in acute distress.    Appearance: Normal appearance. She is well-developed.  HENT:     Head: Normocephalic and atraumatic.     Nose: Nose normal.  Eyes:     Pupils: Pupils are equal, round, and reactive to light.  Cardiovascular:     Rate and Rhythm: Normal rate and regular rhythm.     Pulses: Normal pulses.     Heart sounds: Normal heart sounds.  Pulmonary:     Effort: Pulmonary effort is normal. No respiratory distress.     Breath sounds: Normal breath sounds.  Abdominal:     General: Bowel sounds are normal. There is no distension.     Palpations: Abdomen is soft.     Tenderness: There is no abdominal tenderness. There is no guarding or rebound.  Genitourinary:    General: Normal vulva.     Comments: No foreign body Musculoskeletal:        General: Normal range of motion.     Cervical back: Neck supple.  Skin:    General: Skin is warm and dry.     Capillary Refill: Capillary refill takes less than 2 seconds.     Findings: No erythema or rash.  Neurological:  General: No focal deficit present.     Mental Status: She is alert.     Deep Tendon Reflexes: Reflexes normal.  Psychiatric:        Mood and Affect: Mood normal.     ED Results / Procedures / Treatments   Labs (all labs ordered are listed, but only abnormal results are displayed) Results for orders placed or performed during the hospital encounter of 11/21/23  Urinalysis, Routine w reflex microscopic -Urine, Clean Catch   Collection Time: 11/21/23 12:56 AM  Result Value Ref Range   Color, Urine YELLOW YELLOW   APPearance CLEAR CLEAR   Specific Gravity, Urine >=1.030 1.005 - 1.030   pH 6.0 5.0 - 8.0   Glucose, UA NEGATIVE NEGATIVE mg/dL   Hgb urine dipstick NEGATIVE NEGATIVE   Bilirubin Urine NEGATIVE NEGATIVE   Ketones, ur NEGATIVE  NEGATIVE mg/dL   Protein, ur NEGATIVE NEGATIVE mg/dL   Nitrite NEGATIVE NEGATIVE   Leukocytes,Ua NEGATIVE NEGATIVE  Pregnancy, urine   Collection Time: 11/21/23 12:56 AM  Result Value Ref Range   Preg Test, Ur NEGATIVE NEGATIVE  Wet prep, genital   Collection Time: 11/21/23  2:36 AM  Result Value Ref Range   Yeast Wet Prep HPF POC NONE SEEN NONE SEEN   Trich, Wet Prep NONE SEEN NONE SEEN   Clue Cells Wet Prep HPF POC NONE SEEN NONE SEEN   WBC, Wet Prep HPF POC <10 <10   Sperm NONE SEEN    No results found.  EKG None  Radiology No results found.  Procedures Procedures    Medications Ordered in ED Medications - No data to display  ED Course/ Medical Decision Making/ A&P                                 Medical Decision Making Patient presents with concern for retained condom  Amount and/or Complexity of Data Reviewed External Data Reviewed: notes.    Details: Previous notes reviewed  Labs: ordered.    Details: Negative pregnancy test, urine is negative for UTI.  Wet prep is negative for yeast, bacterial vaginosis, trichomonas and white cells.    Risk Risk Details: No retained foreign body on exam.  Wet prep is negative for infection. GC and chlamydia sent and pending at this time.  Stable for discharge with close follow up.  Patient will be called for positive results.     Final Clinical Impression(s) / ED Diagnoses Final diagnoses:  None   No signs of systemic illness or infection. The patient is nontoxic-appearing on exam and vital signs are within normal limits.  I have reviewed the triage vital signs and the nursing notes. Pertinent labs & imaging results that were available during my care of the patient were reviewed by me and considered in my medical decision making (see chart for details). After history, exam, and medical workup I feel the patient has been appropriately medically screened and is safe for discharge home. Pertinent diagnoses were discussed  with the patient. Patient was given return precautions.  Rx / DC Orders ED Discharge Orders     None         Ludwig Tugwell, MD 11/21/23 [phone]

## 2024-05-12 ENCOUNTER — Encounter (HOSPITAL_BASED_OUTPATIENT_CLINIC_OR_DEPARTMENT_OTHER): Payer: Self-pay

## 2024-05-12 ENCOUNTER — Other Ambulatory Visit: Payer: Self-pay

## 2024-05-12 DIAGNOSIS — Z91040 Latex allergy status: Secondary | ICD-10-CM | POA: Insufficient documentation

## 2024-05-12 DIAGNOSIS — N898 Other specified noninflammatory disorders of vagina: Secondary | ICD-10-CM | POA: Insufficient documentation

## 2024-05-12 NOTE — ED Triage Notes (Addendum)
 Pt states she is on her period, 1 week of vaginal irritation and discharge +odor +dysuria and would like to be checked for GC/Chlamydia

## 2024-05-13 ENCOUNTER — Emergency Department (HOSPITAL_BASED_OUTPATIENT_CLINIC_OR_DEPARTMENT_OTHER)
Admission: EM | Admit: 2024-05-13 | Discharge: 2024-05-13 | Disposition: A | Discharge status: Home / Self Care | Attending: Emergency Medicine | Admitting: Emergency Medicine

## 2024-05-13 DIAGNOSIS — N898 Other specified noninflammatory disorders of vagina: Secondary | ICD-10-CM

## 2024-05-13 LAB — WET PREP, GENITAL
Clue Cells Wet Prep HPF POC: NONE SEEN
Sperm: NONE SEEN
Trich, Wet Prep: NONE SEEN
WBC, Wet Prep HPF POC: >=10 — AB (ref <10)
Yeast Wet Prep HPF POC: NONE SEEN

## 2024-05-13 LAB — URINALYSIS, MICROSCOPIC (REFLEX)

## 2024-05-13 LAB — URINALYSIS, ROUTINE W REFLEX MICROSCOPIC
Bilirubin Urine: NEGATIVE
Glucose, UA: NEGATIVE mg/dL
Ketones, ur: NEGATIVE mg/dL
Leukocytes,Ua: NEGATIVE
Nitrite: NEGATIVE
Protein, ur: NEGATIVE mg/dL
Specific Gravity, Urine: 1.015 (ref 1.005–1.030)
pH: 5.5 (ref 5.0–8.0)

## 2024-05-13 LAB — PREGNANCY, URINE: Preg Test, Ur: NEGATIVE

## 2024-05-13 MED ORDER — CEFTRIAXONE SODIUM 1 G IJ SOLR
1.0000 g | Freq: Once | INTRAMUSCULAR | Status: AC
  Administered 2024-05-13: 1 g via INTRAMUSCULAR
  Filled 2024-05-13: qty 10

## 2024-05-13 MED ORDER — DOXYCYCLINE HYCLATE 100 MG PO TABS
100.0000 mg | ORAL_TABLET | Freq: Once | ORAL | Status: AC
  Administered 2024-05-13: 100 mg via ORAL
  Filled 2024-05-13: qty 1

## 2024-05-13 MED ORDER — STERILE WATER FOR INJECTION IJ SOLN
INTRAMUSCULAR | Status: AC
  Filled 2024-05-13: qty 10

## 2024-05-13 MED ORDER — DOXYCYCLINE HYCLATE 100 MG PO CAPS: 100.0000 mg | ORAL_CAPSULE | Freq: Two times a day (BID) | ORAL | 0 refills | Status: AC

## 2024-05-13 NOTE — ED Provider Notes (Signed)
 [place] [facility]  Provider Note  CSN: [id number] Arrival date & time: 05/12/24 2300  History Chief Complaint  Patient presents with   Vaginal Itching    Alicia Morgan is a 33 y.o. female here for evaluation of intermittent vaginal dryness, itching and discharge/odor. Seen for similar earlier this year with negative workup. She reports she has had unprotected sex and would like to be tested for STI.    Home Medications Prior to Admission medications   Medication Sig Start Date End Date Taking? Authorizing Provider  doxycycline  (VIBRAMYCIN ) 100 MG capsule Take 1 capsule (100 mg total) by mouth 2 (two) times daily for 7 days. 05/13/24 05/20/24 Yes Roselyn Carlin NOVAK, MD  cetirizine (ZYRTEC) 10 MG tablet Take 10 mg by mouth daily.    [provider]  Multiple Vitamin (MULTIVITAMIN WITH MINERALS) TABS tablet Take 1 tablet by mouth daily.    [provider]  traMADol  (ULTRAM ) 50 MG tablet Take 1 tablet (50 mg total) by mouth every 6 (six) hours as needed. 02/22/22   Trudy Earnie CROME, CNM     Allergies    Oxycodone  and Latex   Review of Systems   Review of Systems Please see HPI for pertinent positives and negatives  Physical Exam BP (!) 132/93 (BP Location: Right Arm)   Pulse 74   Temp 98.4 F (36.9 C) (Oral)   Resp 16   Ht 5' 8 (1.727 m)   Wt 105.2 kg   LMP 05/07/2024 (Exact Date)   SpO2 100%   BMI 35.28 kg/m   Physical Exam Vitals and nursing note reviewed.  HENT:     Head: Normocephalic.     Nose: Nose normal.  Eyes:     Extraocular Movements: Extraocular movements intact.  Pulmonary:     Effort: Pulmonary effort is normal.  Musculoskeletal:        General: Normal range of motion.     Cervical back: Neck supple.  Skin:    Findings: No rash (on exposed skin).  Neurological:     Mental Status: She is alert and oriented to person, place, and time.  Psychiatric:        Mood and Affect: Mood  normal.     ED Results / Procedures / Treatments   EKG None  Procedures Procedures  Medications Ordered in the ED Medications  cefTRIAXone  (ROCEPHIN ) injection 1 g (has no administration in time range)  doxycycline  (VIBRA -TABS) tablet 100 mg (has no administration in time range)    Initial Impression and Plan  Patient here for vaginal symptoms off and on for several months, seems to be correlated with her menstrual cycle. She has not seen Ob/Gyn for same yet, she is between PCPs. She had labs done in triage showing negative UA, HCG and wet prep. She would like empiric treatment for GC/CT pending that result. Referral to [facility]   ED Course       MDM Rules/Calculators/A&P Medical Decision Making Problems Addressed: Vaginal irritation: acute illness or injury  Amount and/or Complexity of Data Reviewed Labs: ordered. Decision-making details documented in ED Course.  Risk Prescription drug management.     Final Clinical Impression(s) / ED Diagnoses Final diagnoses:  Vaginal irritation    Rx / DC Orders ED Discharge Orders          Ordered    doxycycline  (VIBRAMYCIN ) 100 MG capsule  2 times daily        05/13/24 0140  Roselyn Carlin NOVAK, MD 05/13/24 [phone]

## 2024-05-14 LAB — GC/CHLAMYDIA PROBE AMP (~~LOC~~) NOT AT ARMC
Chlamydia: NEGATIVE
Comment: NEGATIVE
Comment: NORMAL
Neisseria Gonorrhea: NEGATIVE

## 2024-05-19 ENCOUNTER — Other Ambulatory Visit: Payer: Self-pay

## 2024-05-19 ENCOUNTER — Encounter (HOSPITAL_BASED_OUTPATIENT_CLINIC_OR_DEPARTMENT_OTHER): Payer: Self-pay | Admitting: Emergency Medicine

## 2024-05-19 ENCOUNTER — Emergency Department (HOSPITAL_BASED_OUTPATIENT_CLINIC_OR_DEPARTMENT_OTHER)
Admission: EM | Admit: 2024-05-19 | Discharge: 2024-05-19 | Disposition: A | Discharge status: Home / Self Care | Attending: Emergency Medicine | Admitting: Emergency Medicine

## 2024-05-19 DIAGNOSIS — Z91040 Latex allergy status: Secondary | ICD-10-CM | POA: Diagnosis not present

## 2024-05-19 DIAGNOSIS — T7840XA Allergy, unspecified, initial encounter: Secondary | ICD-10-CM

## 2024-05-19 DIAGNOSIS — T7802XA Anaphylactic reaction due to shellfish (crustaceans), initial encounter: Secondary | ICD-10-CM | POA: Diagnosis present

## 2024-05-19 MED ORDER — DEXAMETHASONE 10 MG/ML FOR PEDIATRIC ORAL USE
10.0000 mg | Freq: Once | INTRAMUSCULAR | Status: AC
  Administered 2024-05-19: 10 mg via ORAL
  Filled 2024-05-19: qty 1

## 2024-05-19 MED ORDER — DEXAMETHASONE 1 MG/ML PO CONC: 10.0000 mg | Freq: Once | ORAL | Status: DC

## 2024-05-19 MED ORDER — EPINEPHRINE 0.3 MG/0.3ML IJ SOAJ: 0.3000 mg | INTRAMUSCULAR | 0 refills | Status: AC | PRN

## 2024-05-19 NOTE — ED Triage Notes (Signed)
 Pt c/o possible allergic rxn- c/o throat tightness, feeling hot, itching, after eating ceviche. Reports full body itching, feels like her lips are swollen.   Pt speaking in full sentences, respirations equal and unlabored, voice clear.   denies known allergy to seafood, shellfish.   RT in triage to assess.  Pt took 1- benadryl  appx 20 min pta.

## 2024-05-19 NOTE — Discharge Instructions (Signed)
 Today you were seen for an allergic reaction.  Please pick up your EpiPen and use as directed.  Please try to avoid your allergen.  Thank you for letting us  treat you today. After performing a physical exam and observation in the emergency department, I feel you are safe to go home. Please follow up with your PCP in the next several days and provide them with your records from this visit. Return to the Emergency Room if pain becomes severe or symptoms worsen.

## 2024-05-19 NOTE — ED Notes (Signed)
 Pt reports generalized hives/itchiness, throat tightness, and tongue swelling sp eating a ceviche bowl this morning. Reaction began within 5 minutes of consumption. Pt states the dish contained shrimp, pico de gallo, and avocado. Pt states she has eaten all these ingredients before with no allergic reaction, though she is allergic to latex. Pt took 25mg  PO benadryl  PTA with mild improvement to symptoms. No acute distress noted. Pt speaking in full sentences. VS WNL.

## 2024-05-19 NOTE — ED Provider Notes (Signed)
 [place] [facility] AT [facility] Provider Note   CSN: [id number] Arrival date & time: 05/19/24  1210     Patient presents with: Allergic Reaction   Alicia Morgan is a 33 y.o. female seen today after a possible allergic reaction.  Patient reports she was eating shrimp ceviche which she has eaten previously and began feeling pruritus, warm, throat itching, tongue, and lip swelling.  Patient denies difficulty breathing.    Allergic Reaction Presenting symptoms: rash        Prior to Admission medications   Medication Sig Start Date End Date Taking? Authorizing Provider  EPINEPHrine 0.3 mg/0.3 mL IJ SOAJ injection Inject 0.3 mg into the muscle as needed for anaphylaxis. 05/19/24  Yes Tonisha Silvey N, PA-C  cetirizine (ZYRTEC) 10 MG tablet Take 10 mg by mouth daily.    [provider]  doxycycline  (VIBRAMYCIN ) 100 MG capsule Take 1 capsule (100 mg total) by mouth 2 (two) times daily for 7 days. 05/13/24 05/20/24  Roselyn Carlin NOVAK, MD  Multiple Vitamin (MULTIVITAMIN WITH MINERALS) TABS tablet Take 1 tablet by mouth daily.    [provider]  traMADol  (ULTRAM ) 50 MG tablet Take 1 tablet (50 mg total) by mouth every 6 (six) hours as needed. 02/22/22   Trudy Earnie CROME, CNM    Allergies: Oxycodone  and Latex    Review of Systems  HENT:  Positive for facial swelling.   Skin:  Positive for rash.    Updated Vital Signs BP (!) 135/99 (BP Location: Left Arm)   Pulse 88   Temp 98.2 F (36.8 C) (Oral)   Resp 19   Ht 5' 8 (1.727 m)   Wt 109.3 kg   LMP 05/07/2024 (Exact Date)   SpO2 99%   Breastfeeding No   BMI 36.64 kg/m   Physical Exam Vitals and nursing note reviewed.  Constitutional:      General: She is not in acute distress.    Appearance: Normal appearance. She is well-developed. She is not ill-appearing.  HENT:     Head: Normocephalic and atraumatic.     Comments: Minimal tongue/lip swelling with no mouth floor or  oropharynx swelling.  Patient able to speak in full sentences without issue. Eyes:     Conjunctiva/sclera: Conjunctivae normal.  Cardiovascular:     Rate and Rhythm: Normal rate and regular rhythm.     Pulses: Normal pulses.     Heart sounds: Normal heart sounds. No murmur heard. Pulmonary:     Effort: Pulmonary effort is normal. No respiratory distress.     Breath sounds: Normal breath sounds.  Abdominal:     Palpations: Abdomen is soft.     Tenderness: There is no abdominal tenderness.  Musculoskeletal:        General: No swelling.     Cervical back: Neck supple.  Skin:    General: Skin is warm and dry.     Capillary Refill: Capillary refill takes less than 2 seconds.     Findings: Rash present.     Comments: Sparse urticaria noted along the bilateral arms with no excoriations noted.  Neurological:     Mental Status: She is alert.  Psychiatric:        Mood and Affect: Mood normal.     (all labs ordered are listed, but only abnormal results are displayed) Labs Reviewed - No data to display  EKG: None  Radiology: No results found.   Procedures   Medications Ordered in the ED  dexamethasone  (  DECADRON ) 10 MG/ML injection for Pediatric ORAL use 10 mg (10 mg Oral Given 05/19/24 1253)                                    Medical Decision Making Risk Prescription drug management.   This patient presents to the ED for concern of allergic reaction differential diagnosis includes anaphylaxis, food allergy, medication reaction    Additional history obtained   Additional history obtained from Electronic Medical Record External records from outside source obtained and reviewed including Care Everywhere   Medicines ordered and prescription drug management:  I ordered medication including Decadron     I have reviewed the patients home medicines and have made adjustments as needed   Problem List / ED Course:  Upon reassessment patient states that all of her  symptoms have resolved.  Patient's breathing still clear and unlabored, urticaria have resolved, and patient no longer feels as though her lips or tongue are swollen. Considered for admission or further workup however patient's vital signs and physical exam are reassuring.  Patient's no signs or symptoms concerning for anaphylaxis.  Patient was given Decadron  while in emergency department and took Benadryl  prior to arrival.  Patient was observed for approximately 2 hours in emergency department without any symptom recurrence.  Patient ordered EpiPen outpatient.  Patient given return precautions.  I feel patient safe for discharge at this time.       Final diagnoses:  Allergic reaction, initial encounter    ED Discharge Orders          Ordered    EPINEPHrine 0.3 mg/0.3 mL IJ SOAJ injection  As needed        05/19/24 1358               Francis Ileana SAILOR, PA-C 05/19/24 1359    Long, Joshua G, MD 05/19/24 1456
